# Patient Record
Sex: Male | Born: 1970 | Race: White | Hispanic: No | Marital: Married | Smoking: Never smoker
Health system: Southern US, Community
[De-identification: ages and names within clinical notes are randomized; demographics above are authoritative.]

## PROBLEM LIST (undated history)

## (undated) DIAGNOSIS — J302 Other seasonal allergic rhinitis: Secondary | ICD-10-CM

## (undated) DIAGNOSIS — Z789 Other specified health status: Secondary | ICD-10-CM

## (undated) DIAGNOSIS — T7840XA Allergy, unspecified, initial encounter: Secondary | ICD-10-CM

## (undated) DIAGNOSIS — E785 Hyperlipidemia, unspecified: Secondary | ICD-10-CM

## (undated) HISTORY — PX: NO PAST SURGERIES: SHX2092

## (undated) HISTORY — PX: WISDOM TOOTH EXTRACTION: SHX21

## (undated) HISTORY — DX: Hyperlipidemia, unspecified: E78.5

## (undated) HISTORY — DX: Allergy, unspecified, initial encounter: T78.40XA

## (undated) HISTORY — PX: REFRACTIVE SURGERY: SHX103

---

## 2011-02-01 ENCOUNTER — Emergency Department (INDEPENDENT_AMBULATORY_CARE_PROVIDER_SITE_OTHER): Payer: Managed Care, Other (non HMO)

## 2011-02-01 ENCOUNTER — Emergency Department (HOSPITAL_BASED_OUTPATIENT_CLINIC_OR_DEPARTMENT_OTHER)
Admission: EM | Admit: 2011-02-01 | Discharge: 2011-02-01 | Disposition: A | Discharge status: Home / Self Care | Payer: Managed Care, Other (non HMO) | Attending: Emergency Medicine | Admitting: Emergency Medicine

## 2011-02-01 ENCOUNTER — Other Ambulatory Visit: Payer: Self-pay | Admitting: Orthopedic Surgery

## 2011-02-01 ENCOUNTER — Encounter: Payer: Self-pay | Admitting: *Deleted

## 2011-02-01 DIAGNOSIS — M25539 Pain in unspecified wrist: Secondary | ICD-10-CM

## 2011-02-01 DIAGNOSIS — S62109A Fracture of unspecified carpal bone, unspecified wrist, initial encounter for closed fracture: Secondary | ICD-10-CM

## 2011-02-01 DIAGNOSIS — M24139 Other articular cartilage disorders, unspecified wrist: Secondary | ICD-10-CM

## 2011-02-01 DIAGNOSIS — W19XXXA Unspecified fall, initial encounter: Secondary | ICD-10-CM

## 2011-02-01 DIAGNOSIS — S6390XA Sprain of unspecified part of unspecified wrist and hand, initial encounter: Secondary | ICD-10-CM

## 2011-02-01 DIAGNOSIS — S52123A Displaced fracture of head of unspecified radius, initial encounter for closed fracture: Secondary | ICD-10-CM

## 2011-02-01 DIAGNOSIS — M25529 Pain in unspecified elbow: Secondary | ICD-10-CM | POA: Insufficient documentation

## 2011-02-01 DIAGNOSIS — S62113A Displaced fracture of triquetrum [cuneiform] bone, unspecified wrist, initial encounter for closed fracture: Secondary | ICD-10-CM

## 2011-02-01 DIAGNOSIS — R51 Headache: Secondary | ICD-10-CM | POA: Insufficient documentation

## 2011-02-01 DIAGNOSIS — W11XXXA Fall on and from ladder, initial encounter: Secondary | ICD-10-CM | POA: Insufficient documentation

## 2011-02-01 NOTE — ED Provider Notes (Signed)
History     CSN: [id number]  Arrival date & time 02/01/11  1045   First MD Initiated Contact with Patient 02/01/11 1148      Chief Complaint  Patient presents with  . Fall    (Consider location/radiation/quality/duration/timing/severity/associated sxs/prior treatment) Patient is a 41 y.o. male presenting with fall. The history is provided by the patient. No language interpreter was used.  Fall The accident occurred less than 1 hour ago. The fall occurred from a ladder. He fell from a height of 3 to 5 ft. He landed on a hard floor. The volume of blood lost was minimal. The point of impact was the head, right wrist and right elbow. The pain is present in the right wrist. The pain is at a severity of 7/10. The pain is moderate. He was not ambulatory at the scene. There was no entrapment after the fall. Pertinent negatives include no visual change. The symptoms are aggravated by extension. He has tried nothing for the symptoms.  Pt complains of pain in his head,  Right wrist and elbow.  Pt has a cut to the back of his head.  Pt did not lose conciousness.    History reviewed. No pertinent past medical history.  History reviewed. No pertinent past surgical history.  No family history on file.  History  Substance Use Topics  . Smoking status: Never Smoker   . Smokeless tobacco: Not on file  . Alcohol Use: No      Review of Systems  Musculoskeletal: Positive for joint swelling.  All other systems reviewed and are negative.    Allergies  Review of patient's allergies indicates no known allergies.  Home Medications  No current outpatient prescriptions on file.  BP 122/77  Pulse 78  Temp(Src) 98.2 F (36.8 C) (Oral)  Resp 18  SpO2 100%  Physical Exam  Nursing note and vitals reviewed. Constitutional: He is oriented to person, place, and time. He appears well-developed and well-nourished.  HENT:  Head: Normocephalic and atraumatic.  Right Ear: External ear normal.  Left  Ear: External ear normal.  Nose: Nose normal.  Mouth/Throat: Oropharynx is clear and moist.       3mm superficial laceration occipital scalp  Eyes: Conjunctivae are normal. Pupils are equal, round, and reactive to light.  Neck: Normal range of motion. Neck supple.  Cardiovascular: Normal rate, regular rhythm and normal heart sounds.   Pulmonary/Chest: Effort normal and breath sounds normal.  Abdominal: Soft.  Musculoskeletal: He exhibits edema and tenderness.       Tender right elbow and right wrist  Neurological: He is alert and oriented to person, place, and time. He has normal reflexes.  Skin: Skin is warm.  Psychiatric: He has a normal mood and affect.    ED Course  Procedures (including critical care time)  Labs Reviewed - No data to display Dg Wrist Complete Right  02/01/2011  *RADIOLOGY REPORT*  Clinical Data: Fall.  Wrist injury and pain.  RIGHT WRIST - COMPLETE 3+ VIEW  Comparison: None.  Findings: Dorsal soft tissue swelling is noted.  Small ossific densities are seen along the posterior and the ulnar aspect of the proximal carpal row, consistent with triquetral avulsion fractures.  No other fractures are identified. Alignment remains normal.  IMPRESSION: Small avulsion fractures from the dorsal aspect of the triquetrum.  Original Report Authenticated By: Danae Orleans, M.D.     No diagnosis found.    MDM   Pt has a fracture to radial head and  to wrist.  (Pt spoke to Dr. Merlyn Lot who can see him in office now.)  Pt placed in a posterior splint and discharged to see Dr. York Spaniel [place], [place] 02/01/11 2212

## 2011-02-01 NOTE — ED Notes (Signed)
Fell 3 feet off a step ladder landing onto concrete. Hit the back of his head. Small laceration noted with bleeding controlled. No loc. Denies headache. Pain and swelling to his right wrist. Radial pulse palpated.

## 2011-02-02 NOTE — ED Provider Notes (Signed)
Medical screening examination/treatment/procedure(s) were performed by non-physician practitioner and as supervising physician I was immediately available for consultation/collaboration.  Mikhaila Roh, MD 02/02/11 0654 

## 2011-02-07 ENCOUNTER — Ambulatory Visit
Admission: RE | Admit: 2011-02-07 | Discharge: 2011-02-07 | Disposition: A | Discharge status: Home / Self Care | Payer: Managed Care, Other (non HMO) | Source: Ambulatory Visit | Attending: Orthopedic Surgery | Admitting: Orthopedic Surgery

## 2011-02-07 ENCOUNTER — Other Ambulatory Visit: Payer: Managed Care, Other (non HMO)

## 2011-02-07 DIAGNOSIS — M24139 Other articular cartilage disorders, unspecified wrist: Secondary | ICD-10-CM

## 2011-02-07 DIAGNOSIS — S62113A Displaced fracture of triquetrum [cuneiform] bone, unspecified wrist, initial encounter for closed fracture: Secondary | ICD-10-CM

## 2011-02-07 DIAGNOSIS — S6390XA Sprain of unspecified part of unspecified wrist and hand, initial encounter: Secondary | ICD-10-CM

## 2011-02-07 DIAGNOSIS — S52123A Displaced fracture of head of unspecified radius, initial encounter for closed fracture: Secondary | ICD-10-CM

## 2011-02-07 MED ORDER — IOHEXOL 180 MG/ML  SOLN
2.0000 mL | Freq: Once | INTRAMUSCULAR | Status: AC | PRN
  Administered 2011-02-07: 2 mL via INTRAVENOUS

## 2011-02-08 ENCOUNTER — Ambulatory Visit
Admission: RE | Admit: 2011-02-08 | Discharge: 2011-02-08 | Disposition: A | Discharge status: Home / Self Care | Payer: Managed Care, Other (non HMO) | Source: Ambulatory Visit | Attending: Orthopedic Surgery | Admitting: Orthopedic Surgery

## 2011-02-08 DIAGNOSIS — M24139 Other articular cartilage disorders, unspecified wrist: Secondary | ICD-10-CM

## 2011-02-08 DIAGNOSIS — S62113A Displaced fracture of triquetrum [cuneiform] bone, unspecified wrist, initial encounter for closed fracture: Secondary | ICD-10-CM

## 2011-02-08 DIAGNOSIS — S6390XA Sprain of unspecified part of unspecified wrist and hand, initial encounter: Secondary | ICD-10-CM

## 2011-02-08 DIAGNOSIS — S52123A Displaced fracture of head of unspecified radius, initial encounter for closed fracture: Secondary | ICD-10-CM

## 2011-02-09 ENCOUNTER — Other Ambulatory Visit: Payer: Self-pay | Admitting: Orthopedic Surgery

## 2011-02-13 ENCOUNTER — Encounter (HOSPITAL_BASED_OUTPATIENT_CLINIC_OR_DEPARTMENT_OTHER): Payer: Self-pay | Admitting: *Deleted

## 2011-02-13 NOTE — Progress Notes (Signed)
Fireman-only seasonal allergies No labs needed

## 2011-02-14 ENCOUNTER — Ambulatory Visit (HOSPITAL_BASED_OUTPATIENT_CLINIC_OR_DEPARTMENT_OTHER)
Admission: RE | Admit: 2011-02-14 | Discharge: 2011-02-14 | Disposition: A | Discharge status: Home / Self Care | Payer: Managed Care, Other (non HMO) | Source: Ambulatory Visit | Attending: Orthopedic Surgery | Admitting: Orthopedic Surgery

## 2011-02-14 ENCOUNTER — Encounter (HOSPITAL_BASED_OUTPATIENT_CLINIC_OR_DEPARTMENT_OTHER): Payer: Self-pay | Admitting: Anesthesiology

## 2011-02-14 ENCOUNTER — Encounter (HOSPITAL_BASED_OUTPATIENT_CLINIC_OR_DEPARTMENT_OTHER): Payer: Self-pay | Admitting: Orthopedic Surgery

## 2011-02-14 ENCOUNTER — Encounter (HOSPITAL_BASED_OUTPATIENT_CLINIC_OR_DEPARTMENT_OTHER): Payer: Self-pay | Admitting: *Deleted

## 2011-02-14 ENCOUNTER — Encounter (HOSPITAL_BASED_OUTPATIENT_CLINIC_OR_DEPARTMENT_OTHER)
Admission: RE | Disposition: A | Discharge status: Home / Self Care | Payer: Self-pay | Source: Ambulatory Visit | Attending: Orthopedic Surgery

## 2011-02-14 ENCOUNTER — Ambulatory Visit (HOSPITAL_BASED_OUTPATIENT_CLINIC_OR_DEPARTMENT_OTHER): Payer: Managed Care, Other (non HMO) | Admitting: Anesthesiology

## 2011-02-14 DIAGNOSIS — S52123A Displaced fracture of head of unspecified radius, initial encounter for closed fracture: Secondary | ICD-10-CM | POA: Insufficient documentation

## 2011-02-14 DIAGNOSIS — X58XXXA Exposure to other specified factors, initial encounter: Secondary | ICD-10-CM | POA: Insufficient documentation

## 2011-02-14 HISTORY — DX: Other specified health status: Z78.9

## 2011-02-14 HISTORY — DX: Other seasonal allergic rhinitis: J30.2

## 2011-02-14 HISTORY — PX: ORIF RADIAL FRACTURE: SHX5113

## 2011-02-14 SURGERY — OPEN REDUCTION INTERNAL FIXATION (ORIF) RADIAL FRACTURE
Anesthesia: General | Site: Elbow | Laterality: Right

## 2011-02-14 MED ORDER — ONDANSETRON HCL 4 MG/2ML IJ SOLN
INTRAMUSCULAR | Status: DC | PRN
  Administered 2011-02-14: 4 mg via INTRAVENOUS

## 2011-02-14 MED ORDER — CHLORHEXIDINE GLUCONATE 4 % EX LIQD: 60.0000 mL | Freq: Once | CUTANEOUS | Status: DC

## 2011-02-14 MED ORDER — LIDOCAINE HCL (CARDIAC) 20 MG/ML IV SOLN
INTRAVENOUS | Status: DC | PRN
  Administered 2011-02-14: 40 mg via INTRAVENOUS

## 2011-02-14 MED ORDER — METOCLOPRAMIDE HCL 5 MG/ML IJ SOLN: 10.0000 mg | Freq: Once | INTRAMUSCULAR | Status: DC | PRN

## 2011-02-14 MED ORDER — MIDAZOLAM HCL 2 MG/2ML IJ SOLN
0.5000 mg | INTRAMUSCULAR | Status: DC | PRN
  Administered 2011-02-14: 2 mg via INTRAVENOUS

## 2011-02-14 MED ORDER — CEFAZOLIN SODIUM-DEXTROSE 2-3 GM-% IV SOLR: 2.0000 g | INTRAVENOUS | Status: DC

## 2011-02-14 MED ORDER — ROPIVACAINE HCL 5 MG/ML IJ SOLN
INTRAMUSCULAR | Status: DC | PRN
  Administered 2011-02-14: 30 mL via EPIDURAL

## 2011-02-14 MED ORDER — LIDOCAINE HCL 1 % IJ SOLN
INTRAMUSCULAR | Status: DC | PRN
  Administered 2011-02-14: 2 mL via INTRADERMAL

## 2011-02-14 MED ORDER — FENTANYL CITRATE 0.05 MG/ML IJ SOLN: 25.0000 ug | INTRAMUSCULAR | Status: DC | PRN

## 2011-02-14 MED ORDER — OXYCODONE-ACETAMINOPHEN 5-325 MG PO TABS: 1.0000 | ORAL_TABLET | ORAL | Status: AC | PRN

## 2011-02-14 MED ORDER — LACTATED RINGERS IV SOLN
INTRAVENOUS | Status: DC
  Administered 2011-02-14 (×2): via INTRAVENOUS

## 2011-02-14 MED ORDER — MORPHINE SULFATE 2 MG/ML IJ SOLN: 0.0500 mg/kg | INTRAMUSCULAR | Status: DC | PRN

## 2011-02-14 MED ORDER — CEFAZOLIN SODIUM 1-5 GM-% IV SOLN: 1.0000 g | INTRAVENOUS | Status: DC

## 2011-02-14 MED ORDER — DEXAMETHASONE SODIUM PHOSPHATE 4 MG/ML IJ SOLN
INTRAMUSCULAR | Status: DC | PRN
  Administered 2011-02-14: 10 mg via INTRAVENOUS

## 2011-02-14 MED ORDER — FENTANYL CITRATE 0.05 MG/ML IJ SOLN
50.0000 ug | INTRAMUSCULAR | Status: DC | PRN
  Administered 2011-02-14: 100 ug via INTRAVENOUS

## 2011-02-14 MED ORDER — PROPOFOL 10 MG/ML IV EMUL
INTRAVENOUS | Status: DC | PRN
  Administered 2011-02-14: 250 mg via INTRAVENOUS

## 2011-02-14 SURGICAL SUPPLY — 57 items
BANDAGE GAUZE ELAST BULKY 4 IN (GAUZE/BANDAGES/DRESSINGS) ×2 IMPLANT
BIT DRILL ACUTRAK MICRO 2 (BIT) ×1 IMPLANT
BLADE MINI RND TIP GREEN BEAV (BLADE) IMPLANT
BLADE SURG 15 STRL LF DISP TIS (BLADE) ×1 IMPLANT
BLADE SURG 15 STRL SS (BLADE) ×1
BNDG COHESIVE 3X5 TAN STRL LF (GAUZE/BANDAGES/DRESSINGS) ×2 IMPLANT
BNDG ESMARK 4X9 LF (GAUZE/BANDAGES/DRESSINGS) ×2 IMPLANT
CHLORAPREP W/TINT 26ML (MISCELLANEOUS) ×2 IMPLANT
CLOTH BEACON ORANGE TIMEOUT ST (SAFETY) ×2 IMPLANT
CORDS BIPOLAR (ELECTRODE) ×2 IMPLANT
COVER MAYO STAND STRL (DRAPES) ×2 IMPLANT
COVER TABLE BACK 60X90 (DRAPES) ×2 IMPLANT
CUFF TOURNIQUET SINGLE 18IN (TOURNIQUET CUFF) ×2 IMPLANT
DECANTER SPIKE VIAL GLASS SM (MISCELLANEOUS) IMPLANT
DRAPE EXTREMITY T 121X128X90 (DRAPE) ×2 IMPLANT
DRAPE OEC MINIVIEW 54X84 (DRAPES) ×2 IMPLANT
DRAPE SURG 17X23 STRL (DRAPES) ×2 IMPLANT
DRILL ACUTRAK MICRO 2 (BIT) ×2
DRSG KUZMA FLUFF (GAUZE/BANDAGES/DRESSINGS) IMPLANT
ELECT REM PT RETURN 9FT ADLT (ELECTROSURGICAL)
ELECTRODE REM PT RTRN 9FT ADLT (ELECTROSURGICAL) IMPLANT
GAUZE XEROFORM 1X8 LF (GAUZE/BANDAGES/DRESSINGS) ×2 IMPLANT
GLOVE BIO SURGEON STRL SZ 6.5 (GLOVE) ×2 IMPLANT
GLOVE BIO SURGEON STRL SZ8.5 (GLOVE) ×2 IMPLANT
GLOVE BIOGEL PI IND STRL 8.5 (GLOVE) ×1 IMPLANT
GLOVE BIOGEL PI INDICATOR 8.5 (GLOVE) ×1
GLOVE SURG ORTHO 8.0 STRL STRW (GLOVE) ×2 IMPLANT
GOWN BRE IMP PREV XXLGXLNG (GOWN DISPOSABLE) ×2 IMPLANT
GOWN PREVENTION PLUS XLARGE (GOWN DISPOSABLE) ×4 IMPLANT
GUIDEWIRE ORTHO MINI ACTK .045 (WIRE) ×4 IMPLANT
KWIRE 4.0 X .045IN (WIRE) IMPLANT
NS IRRIG 1000ML POUR BTL (IV SOLUTION) ×2 IMPLANT
PACK BASIN DAY SURGERY FS (CUSTOM PROCEDURE TRAY) ×2 IMPLANT
PAD CAST 3X4 CTTN HI CHSV (CAST SUPPLIES) ×1 IMPLANT
PADDING CAST ABS 4INX4YD NS (CAST SUPPLIES) ×1
PADDING CAST ABS COTTON 4X4 ST (CAST SUPPLIES) ×1 IMPLANT
PADDING CAST COTTON 3X4 STRL (CAST SUPPLIES) ×1
PENCIL BUTTON HOLSTER BLD 10FT (ELECTRODE) IMPLANT
SCREW ACUTRAK 2 MINI 22MM (Screw) ×4 IMPLANT
SLEEVE SCD COMPRESS KNEE MED (MISCELLANEOUS) ×2 IMPLANT
SPLINT PLASTER CAST XFAST 3X15 (CAST SUPPLIES) ×15 IMPLANT
SPLINT PLASTER XTRA FASTSET 3X (CAST SUPPLIES) ×15
SPONGE GAUZE 4X4 12PLY (GAUZE/BANDAGES/DRESSINGS) ×2 IMPLANT
STOCKINETTE 4X48 STRL (DRAPES) ×2 IMPLANT
SUT VIC AB 0 CT1 27 (SUTURE) ×1
SUT VIC AB 0 CT1 27XBRD ANBCTR (SUTURE) ×1 IMPLANT
SUT VIC AB 2-0 SH 27 (SUTURE)
SUT VIC AB 2-0 SH 27XBRD (SUTURE) IMPLANT
SUT VIC AB 3-0 FS2 27 (SUTURE) IMPLANT
SUT VICRYL 4-0 PS2 18IN ABS (SUTURE) ×2 IMPLANT
SUT VICRYL RAPID 5 0 P 3 (SUTURE) IMPLANT
SUT VICRYL RAPIDE 4/0 PS 2 (SUTURE) ×2 IMPLANT
SYR BULB 3OZ (MISCELLANEOUS) ×2 IMPLANT
SYR CONTROL 10ML LL (SYRINGE) IMPLANT
TOWEL OR 17X24 6PK STRL BLUE (TOWEL DISPOSABLE) ×2 IMPLANT
UNDERPAD 30X30 INCONTINENT (UNDERPADS AND DIAPERS) ×2 IMPLANT
WATER STERILE IRR 1000ML POUR (IV SOLUTION) IMPLANT

## 2011-02-14 NOTE — Op Note (Signed)
Dictated number: [id number]

## 2011-02-14 NOTE — H&P (Signed)
Gabriel Snyder is a 41 year-old right-hand dominant male who is referred by his wife.  He suffered a fall today on his right dominant arm.  He was seen at [facility] where x-rays reveal a fracture of his radial head and a fracture of his triquetrum.  He complains of wrist, elbow pain. He was placed in a splint.  He complains of an intermittent, moderate aching type pain with a feeling of tingling. Activity makes this worse.  He has no prior history of injury.  He states he fell three to four week from the stepladder and there is no family history of diabetes, thyroid problems, arthritis or gout.  ALLERGIES:   None.  MEDICATIONS:    Allergy injections.  SURGICAL HISTORY:    LASIK surgery  FAMILY MEDICAL HISTORY:   Negative.  SOCIAL HISTORY:    He does not smoke or drink.  He is married. He is a IT sales professional.   Gabriel Snyder is an 41 y.o. male.     Chief Complaint: Fracture radial head rt HPI: see above  Past Medical History  Diagnosis Date  . Seasonal allergies   . No pertinent past medical history     Past Surgical History  Procedure Date  . Wisdom tooth extraction   . Refractive surgery   . No past surgeries     History reviewed. No pertinent family history. Social History:  reports that he has never smoked. He does not have any smokeless tobacco history on file. He reports that he drinks alcohol. He reports that he does not use illicit drugs.  Allergies: No Known Allergies  Medications Prior to Admission  Medication Dose Route Frequency Provider Last Rate Last Dose  . ceFAZolin (ANCEF) IVPB 2 g/50 mL premix  2 g Intravenous 60 min Pre-Op Nicki Reaper, MD      . chlorhexidine (HIBICLENS) 4 % liquid 4 application  60 mL Topical Once       . fentaNYL (SUBLIMAZE) injection 50-100 mcg  50-100 mcg Intravenous PRN Constance Goltz, MD   100 mcg at 02/14/11 0854  . lactated ringers infusion   Intravenous Continuous Constance Goltz, MD 10 mL/hr at 02/14/11  0848    . midazolam (VERSED) injection 0.5-2 mg  0.5-2 mg Intravenous PRN Constance Goltz, MD   2 mg at 02/14/11 0854  . DISCONTD: ceFAZolin (ANCEF) IVPB 1 g/50 mL premix  1 g Intravenous 60 min Pre-Op        Medications Prior to Admission  Medication Sig Dispense Refill  . cetirizine (ZYRTEC) 10 MG tablet Take 10 mg by mouth as needed.      Marland Kitchen HYDROcodone-acetaminophen (NORCO) 5-325 MG per tablet Take 1 tablet by mouth every 6 (six) hours as needed.      Marland Kitchen ibuprofen (ADVIL,MOTRIN) 200 MG tablet Take 200 mg by mouth every 6 (six) hours as needed.        Results for orders placed during the hospital encounter of 02/14/11 (from the past 48 hour(s))  POCT HEMOGLOBIN-HEMACUE     Status: Normal   Collection Time   02/14/11  8:51 AM      Component Value Range Comment   Hemoglobin 15.6  13.0 - 17.0 (g/dL)     No results found.   Pertinent items are noted in HPI.  Blood pressure 127/79, pulse 78, temperature 98.1 F (36.7 C), temperature source Oral, resp. rate 18, height 5\' 11"  (1.803 m), weight 83.915 kg (185 lb), SpO2 96.00%.  General appearance: alert, cooperative and appears stated age Head: Normocephalic, without obvious abnormality Neck: no adenopathy Resp: clear to auscultation bilaterally Cardio: regular rate and rhythm, S1, S2 normal, no murmur, click, rub or gallop GI: soft, non-tender; bowel sounds normal; no masses,  no organomegaly Extremities: extremities normal, atraumatic, no cyanosis or edema Pulses: 2+ and symmetric Skin: Skin color, texture, turgor normal. No rashes or lesions Neurologic: Grossly normal Incision/Wound: na  Assessment/Plan We will plan on ORIF, possible radial head replacement. We will treat the distal radius conservatively with a splint cast. He is advised of potential for infection, injury to arteries, nerves, tendons, incomplete relief of symptoms and dystrophy, possibility of nonunion, dislocation, loss of fixation of the radial head,  arthritic changes, stiffness to his elbow, prosthesis failure. He would like to proceed.   Willodene Stallings R 02/14/2011, 10:38 AM

## 2011-02-14 NOTE — Transfer of Care (Signed)
Immediate Anesthesia Transfer of Care Note  Patient: Gabriel Snyder  Procedure(s) Performed:  OPEN REDUCTION INTERNAL FIXATION (ORIF) RADIAL FRACTURE - right radial head, possible radial head replacement  Patient Location: PACU  Anesthesia Type: GA combined with regional for post-op pain  Level of Consciousness: sedated  Airway & Oxygen Therapy: Patient Spontanous Breathing and Patient connected to face mask oxygen  Post-op Assessment: Report given to PACU RN and Post -op Vital signs reviewed and stable  Post vital signs: Reviewed and stable Filed Vitals:   02/14/11 0910  BP:   Pulse: 78  Temp:   Resp: 18    Complications: No apparent anesthesia complications

## 2011-02-14 NOTE — Anesthesia Postprocedure Evaluation (Signed)
Anesthesia Post Note  Patient: Gabriel Snyder  Procedure(s) Performed:  OPEN REDUCTION INTERNAL FIXATION (ORIF) RADIAL FRACTURE - right radial head, possible radial head replacement  Anesthesia type: General  Patient location: PACU  Post pain: Pain level controlled  Post assessment: Patient's Cardiovascular Status Stable  Last Vitals:  Filed Vitals:   02/14/11 1245  BP:   Pulse: 71  Temp:   Resp: 12    Post vital signs: Reviewed and stable  Level of consciousness: alert  Complications: No apparent anesthesia complications

## 2011-02-14 NOTE — Brief Op Note (Signed)
02/14/2011  12:00 PM  PATIENT:  Quitman Livings  41 y.o. male  PRE-OPERATIVE DIAGNOSIS:  fracture right radial head  POST-OPERATIVE DIAGNOSIS:  fracture right radial head  PROCEDURE:  Procedure(s): OPEN REDUCTION INTERNAL FIXATION (ORIF) RADIAL FRACTURE  SURGEON:  Surgeon(s): Nicki Reaper, MD Marlowe Shores, MD  PHYSICIAN ASSISTANT:   ASSISTANTS: Alyssa Grove, MD   ANESTHESIA:   regional and general  EBL:  Total I/O In: 1400 [I.V.:1400] Out: -   BLOOD ADMINISTERED:none  DRAINS: none   LOCAL MEDICATIONS USED:  NONE  SPECIMEN:  No Specimen  DISPOSITION OF SPECIMEN:  N/A  COUNTS:  YES  TOURNIQUET:   Total Tourniquet Time Documented: Upper Arm (Left) - 46 minutes  DICTATION: .Other Dictation: Dictation Number [id number]  PLAN OF CARE: Discharge to home after PACU  PATIENT DISPOSITION:  PACU - hemodynamically stable.

## 2011-02-14 NOTE — Anesthesia Procedure Notes (Addendum)
Anesthesia Regional Block:  Supraclavicular block  Pre-Anesthetic Checklist: ,, timeout performed, Correct Patient, Correct Site, Correct Laterality, Correct Procedure, Correct Position, site marked, Risks and benefits discussed,  Surgical consent,  Pre-op evaluation,  At surgeon's request and post-op pain management  Laterality: Right  Prep: chloraprep       Needles:   Needle Type: Other   (Arrow Echogenic)   Needle Length: 9cm  Needle Gauge: 21    Additional Needles:  Procedures: ultrasound guided Supraclavicular block Narrative:  Start time: 02/14/2011 8:53 AM End time: 02/14/2011 9:04 AM Injection made incrementally with aspirations every 5 mL.  Performed by: Personally  Anesthesiologist: C Frederick  Additional Notes: Ultrasound guidance used to: id relevant anatomy, confirm needle position, local anesthetic spread, avoidance of vascular puncture. Picture saved. No complications. Block performed personally by Janetta Hora. Gelene Mink, MD    Supraclavicular block Procedure Name: LMA Insertion Date/Time: 02/14/2011 10:54 AM Performed by: Signa Kell Pre-anesthesia Checklist: Patient identified, Emergency Drugs available, Suction available and Patient being monitored Patient Re-evaluated:Patient Re-evaluated prior to inductionOxygen Delivery Method: Circle System Utilized Preoxygenation: Pre-oxygenation with 100% oxygen Intubation Type: IV induction Ventilation: Mask ventilation without difficulty LMA: LMA inserted LMA Size: 5.0 Number of attempts: 1 Airway Equipment and Method: bite block Placement Confirmation: positive ETCO2 Tube secured with: Tape Dental Injury: Teeth and Oropharynx as per pre-operative assessment

## 2011-02-14 NOTE — Progress Notes (Signed)
Assisted Dr. Frederick with right, ultrasound guided, supraclavicular block. Side rails up, monitors on throughout procedure. See vital signs in flow sheet. Tolerated Procedure well. 

## 2011-02-14 NOTE — Anesthesia Preprocedure Evaluation (Signed)
Anesthesia Evaluation  Patient identified by MRN, date of birth, ID band Patient awake    Reviewed: Allergy & Precautions, H&P , NPO status , Patient's Chart, lab work & pertinent test results, reviewed documented beta blocker date and time   Airway Mallampati: II TM Distance: >3 FB Neck ROM: full    Dental   Pulmonary neg pulmonary ROS,          Cardiovascular neg cardio ROS     Neuro/Psych Negative Neurological ROS  Negative Psych ROS   GI/Hepatic negative GI ROS, Neg liver ROS,   Endo/Other  Negative Endocrine ROS  Renal/GU negative Renal ROS  Genitourinary negative   Musculoskeletal   Abdominal   Peds  Hematology negative hematology ROS (+)   Anesthesia Other Findings See surgeon's H&P   Reproductive/Obstetrics negative OB ROS                           Anesthesia Physical Anesthesia Plan  ASA: I  Anesthesia Plan: General   Post-op Pain Management: MAC Combined w/ Regional for Post-op pain   Induction: Intravenous  Airway Management Planned: LMA  Additional Equipment:   Intra-op Plan:   Post-operative Plan: Extubation in OR  Informed Consent: I have reviewed the patients History and Physical, chart, labs and discussed the procedure including the risks, benefits and alternatives for the proposed anesthesia with the patient or authorized representative who has indicated his/her understanding and acceptance.     Plan Discussed with: CRNA and Surgeon  Anesthesia Plan Comments:         Anesthesia Quick Evaluation  

## 2011-02-15 ENCOUNTER — Encounter (HOSPITAL_BASED_OUTPATIENT_CLINIC_OR_DEPARTMENT_OTHER): Payer: Self-pay | Admitting: Orthopedic Surgery

## 2011-02-15 NOTE — Op Note (Signed)
NAMEOVERTON, BOGGUS NO.:  [id number]  MEDICAL RECORD NO.:  [id number]  LOCATION:                                 FACILITY:  PHYSICIAN:  Cindee Salt, M.D.       DATE OF BIRTH:  [birth year]  DATE OF PROCEDURE:  02/14/2011 DATE OF DISCHARGE:                              OPERATIVE REPORT   PREOPERATIVE DIAGNOSIS:  Fracture of radial head, right elbow.  POSTOPERATIVE DIAGNOSIS:  Fracture of radial head, right elbow.  OPERATION:  Open reduction and internal fixation, radial head fracture, right elbow.  SURGEON:  Cindee Salt, MD  ASSISTANT:  Artist Pais. Mina Marble, MD  ANESTHESIA:  Axillary general.  ANESTHESIOLOGIST:  Janetta Hora. 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  He has elected to undergo open reduction and internal fixation, possible replacement of the radial head fracture with displacement.  The distal radius fracture is nondisplaced.  He is aware of risks and complications including infection, recurrence, injury to arteries, nerves, tendons, incomplete relief of symptoms, dystrophy, stiffness, loss of mobility to the elbow, the possibility of further procedures being necessary especially with radial head replacement.  In the preoperative area, the patient is seen.  The extremity marked by both the patient and surgeon. Antibiotic given.  PROCEDURE:  The patient was brought to the operating room where a general anesthesia was carried out without difficulty after a supraclavicular block was carried out.  He was prepped using ChloraPrep, supine position with the right arm free.  A 3-minute dry time was allowed.  Time-out taken, confirming the patient and procedure.  The limb was exsanguinated with an Esmarch bandage.  Tourniquet placed high on the arm was inflated to 250 mmHg.  The fovea of the distal ulna was marked using image intensification with the  possibility of a radial head replacement.  A longitudinal incision was made obliquely over the lateral epicondyle of the right elbow and carried down through subcutaneous tissue.  Bleeders were electrocauterized.  The dissection was carried down to the anconeus and extensor interval.  This was deepened down to the joint.  The radial head and neck were identified. The fracture was immediately apparent.  However, this was intact on the most radial side.  The ulnar side was sheared.  With pronation and supination, was not able to be visualized across, appeared to be one large fragment.  This was then elevated with the periosteal elevator.  X- rays confirmed the elevation.  This was maintained in position and 2 guide pins for Acutrak mini screws were placed.  X-rays again confirmed reduction of the fracture in full pronation supination and oblique. This allowed the pins to be measured.  These measured approximately 24 mm.  The 22 screws were then selected.  The cortex broached on the radial side and 2 Acutrak screws 22 mm in length were each inserted. Full pronation, supination was allowed following implementation of the screws.  X-rays in AP, lateral in full pronation supination revealed the fracture reduced in both AP and lateral directions with it well compressed.  The wound was  copiously irrigated with saline.  The interval was then closed along with the joint with figure-of-eight 0 Vicryl sutures, the subcutaneous tissue closed with interrupted 2-0 Vicryl, and the skin with a subcuticular 4-0 Vicryl Rapide suture.  A sterile compressive dressing and long-arm splint was applied.  X-rays of the wrist revealed no displacement of the distal radius fracture.  On deflation of the tourniquet, all fingers immediately pinked.  He was taken to the recovery room for observation in satisfactory condition.          ______________________________ Cindee Salt, M.D.     GK/MEDQ  D:  02/14/2011   T:  02/15/2011  Job:  [id number]

## 2011-10-15 ENCOUNTER — Other Ambulatory Visit: Payer: Self-pay | Admitting: Occupational Medicine

## 2011-10-15 ENCOUNTER — Ambulatory Visit (HOSPITAL_BASED_OUTPATIENT_CLINIC_OR_DEPARTMENT_OTHER)
Admission: RE | Admit: 2011-10-15 | Discharge: 2011-10-15 | Disposition: A | Discharge status: Home / Self Care | Payer: Self-pay | Source: Ambulatory Visit | Attending: Occupational Medicine | Admitting: Occupational Medicine

## 2011-10-15 DIAGNOSIS — Z0000 Encounter for general adult medical examination without abnormal findings: Secondary | ICD-10-CM | POA: Insufficient documentation

## 2013-01-25 IMAGING — CR DG CHEST 1V
1 series · 1 of 1 positions shown · non-contrast
Comparison: No priors.

CLINICAL DATA: Physical examination.

CHEST - 1 VIEW

[w chest pa]
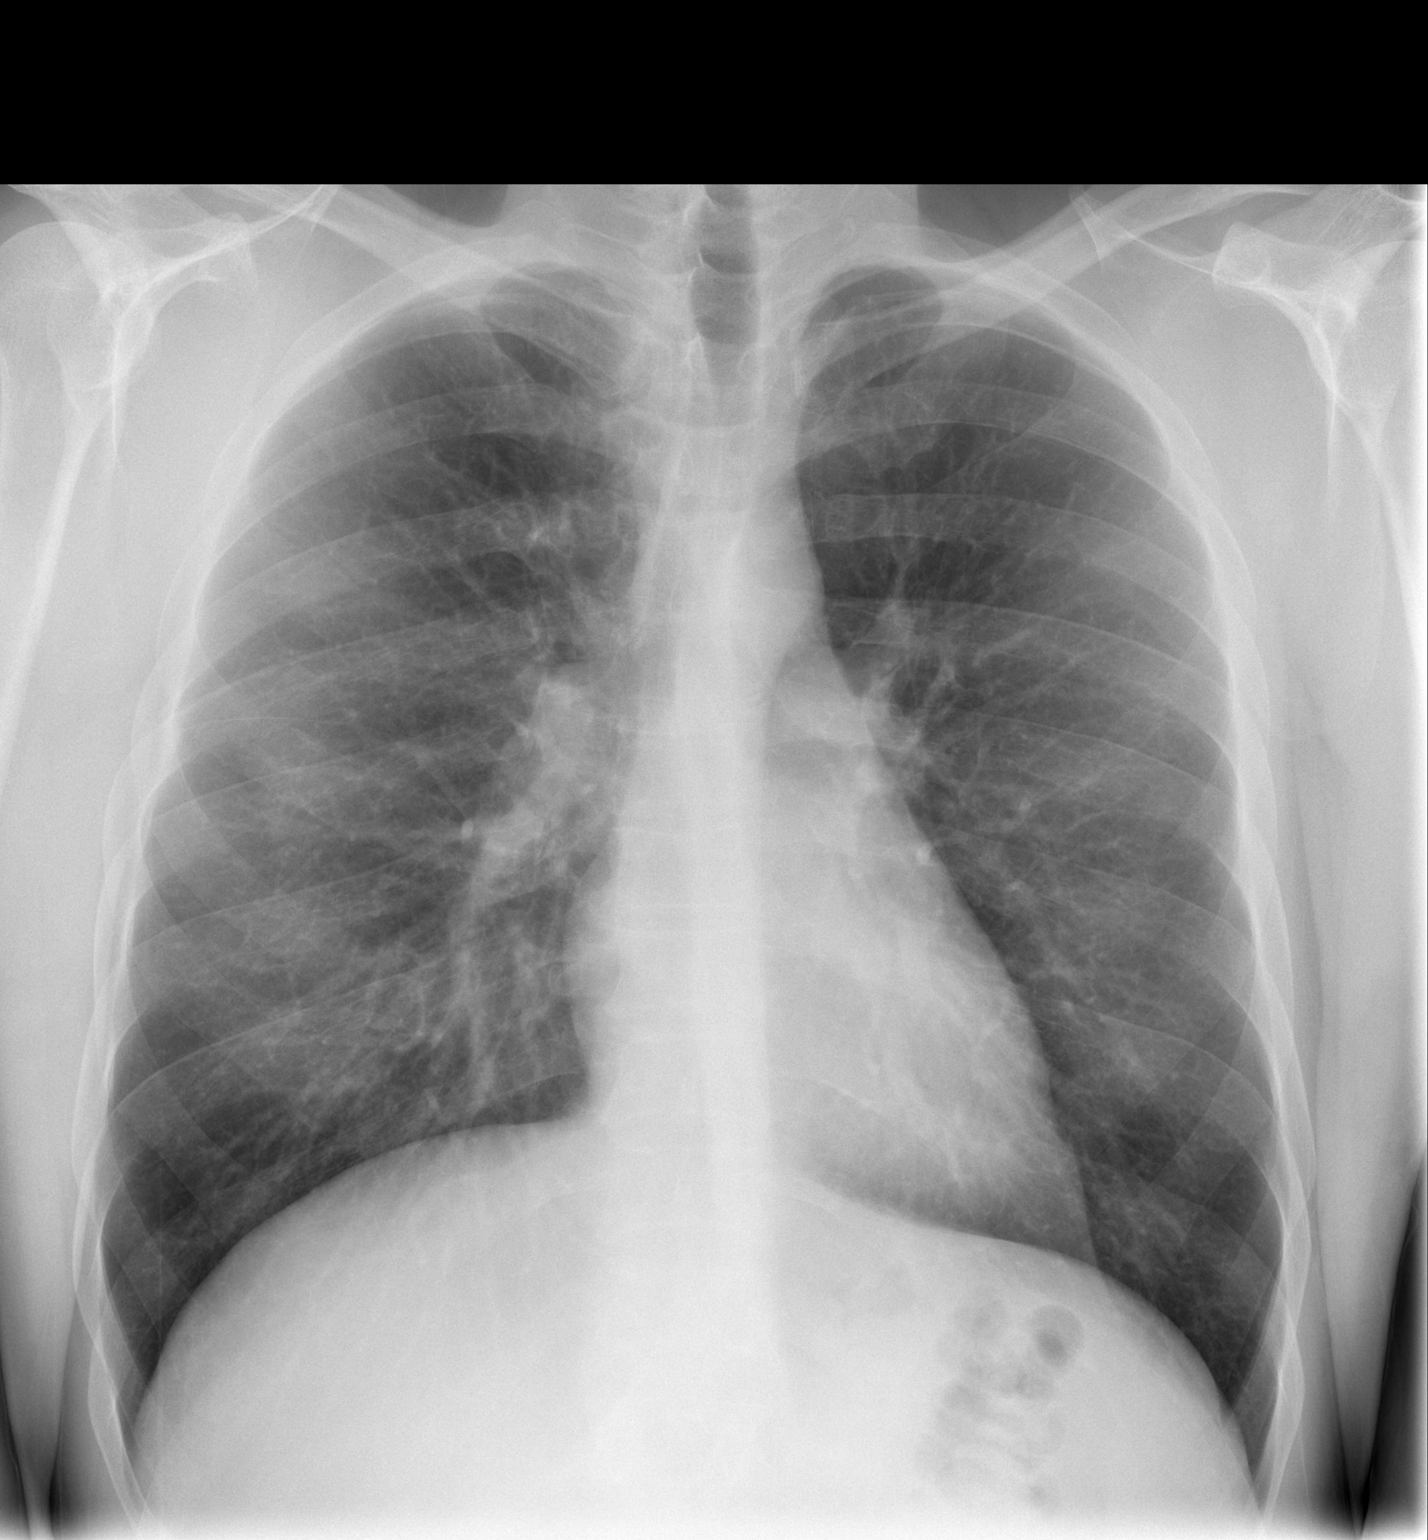

[1 of 1 positions shown; findings below may reference images not displayed]

FINDINGS: Lung volumes are normal.  No consolidative airspace
disease.  No pleural effusions.  No pneumothorax.  No pulmonary
nodule or mass noted.  Pulmonary vasculature and the
cardiomediastinal silhouette are within normal limits.
IMPRESSION: 1. No radiographic evidence of acute cardiopulmonary disease.

## 2015-10-20 DIAGNOSIS — J302 Other seasonal allergic rhinitis: Secondary | ICD-10-CM | POA: Insufficient documentation

## 2015-10-20 DIAGNOSIS — K219 Gastro-esophageal reflux disease without esophagitis: Secondary | ICD-10-CM | POA: Insufficient documentation

## 2019-10-20 ENCOUNTER — Ambulatory Visit: Payer: Managed Care, Other (non HMO) | Admitting: Family Medicine

## 2019-11-06 ENCOUNTER — Ambulatory Visit: Payer: Managed Care, Other (non HMO) | Admitting: Family Medicine

## 2020-02-17 ENCOUNTER — Ambulatory Visit: Payer: Managed Care, Other (non HMO) | Admitting: Family Medicine

## 2020-03-28 ENCOUNTER — Ambulatory Visit: Payer: Managed Care, Other (non HMO) | Admitting: Family Medicine

## 2020-03-31 ENCOUNTER — Ambulatory Visit: Payer: Managed Care, Other (non HMO) | Admitting: Medical-Surgical

## 2020-04-01 ENCOUNTER — Ambulatory Visit: Payer: Managed Care, Other (non HMO) | Admitting: Medical-Surgical

## 2020-04-01 ENCOUNTER — Other Ambulatory Visit: Payer: Self-pay

## 2020-04-01 ENCOUNTER — Encounter: Payer: Self-pay | Admitting: Medical-Surgical

## 2020-04-01 VITALS — BP 118/76 | HR 70 | Temp 98.3°F | Ht 71.0 in | Wt 189.4 lb

## 2020-04-01 DIAGNOSIS — Z1329 Encounter for screening for other suspected endocrine disorder: Secondary | ICD-10-CM

## 2020-04-01 DIAGNOSIS — Z1159 Encounter for screening for other viral diseases: Secondary | ICD-10-CM

## 2020-04-01 DIAGNOSIS — Z0000 Encounter for general adult medical examination without abnormal findings: Secondary | ICD-10-CM | POA: Diagnosis not present

## 2020-04-01 DIAGNOSIS — Z1211 Encounter for screening for malignant neoplasm of colon: Secondary | ICD-10-CM

## 2020-04-01 DIAGNOSIS — Z114 Encounter for screening for human immunodeficiency virus [HIV]: Secondary | ICD-10-CM | POA: Diagnosis not present

## 2020-04-01 DIAGNOSIS — Z7689 Persons encountering health services in other specified circumstances: Secondary | ICD-10-CM | POA: Diagnosis not present

## 2020-04-01 LAB — CBC WITH DIFFERENTIAL/PLATELET
Lymphs Abs: 1580 cells/uL (ref 850–3900)
Neutro Abs: 4313 cells/uL (ref 1500–7800)

## 2020-04-01 NOTE — Progress Notes (Signed)
New Patient Office Visit  Subjective:  Patient ID: Gabriel Snyder, male    DOB: [birth year]  Age: 50 y.o. MRN: [id number]  CC:  Chief Complaint  Patient presents with   Establish Care   Annual Exam    HPI Gabriel Snyder presents to establish care.   Dentist: every 6 months, no concerns Eye exam: at least 2 years, just readers glasses, had Lasik surgery 1997 Exercise: regular activity  Diet: no special diets, eats all food groups Colon cancer screening:  Prostate cancer screening: done 07/2019, normal COVID vaccines: Done  No concerns today.    Past Medical History:  Diagnosis Date   No pertinent past medical history    No pertinent past medical history    Seasonal allergies     Past Surgical History:  Procedure Laterality Date   ORIF RADIAL FRACTURE  02/14/2011   Procedure: OPEN REDUCTION INTERNAL FIXATION (ORIF) RADIAL FRACTURE;  Surgeon: Wynonia Sours, MD;  Location: [facility];  Service: Orthopedics;  Laterality: Right;  right radial head, possible radial head replacement   REFRACTIVE SURGERY     WISDOM TOOTH EXTRACTION      Family History  Problem Relation Age of Onset   Hypertension Other    Stroke Other     Social History   Socioeconomic History   Marital status: Married    Spouse name: Not on file   Number of children: Not on file   Years of education: Not on file   Highest education level: Not on file  Occupational History   Not on file  Tobacco Use   Smoking status: Never Smoker   Smokeless tobacco: Never Used  Substance and Sexual Activity   Alcohol use: Not Currently   Drug use: Never   Sexual activity: Yes    Partners: Female  Other Topics Concern   Not on file  Social History Narrative   Not on file   Social Determinants of Health   Financial Resource Strain: Not on file  Food Insecurity: Not on file  Transportation Needs: Not on file  Physical Activity: Not on file  Stress: Not on file  Social  Connections: Not on file  Intimate Partner Violence: Not on file    ROS Review of Systems  Constitutional: Negative for chills, fatigue, fever and unexpected weight change.  HENT: Negative for congestion, rhinorrhea, sinus pressure and sore throat.   Eyes: Negative for visual disturbance.  Respiratory: Negative for cough, chest tightness, shortness of breath and wheezing.   Cardiovascular: Negative for chest pain, palpitations and leg swelling.  Gastrointestinal: Negative for abdominal pain, blood in stool, constipation, diarrhea, nausea and vomiting.  Endocrine: Negative for cold intolerance, heat intolerance, polydipsia, polyphagia and polyuria.  Genitourinary: Negative for dysuria, frequency, hematuria and urgency.  Musculoskeletal: Positive for arthralgias (bilateral knees).  Skin: Negative for rash.  Allergic/Immunologic: Negative for environmental allergies and food allergies.  Neurological: Negative for dizziness, light-headedness and headaches.  Psychiatric/Behavioral: Negative for dysphoric mood, self-injury, sleep disturbance and suicidal ideas. The patient is not nervous/anxious.     Objective:   Today's Vitals: BP 118/76    Pulse 70    Temp 98.3 F (36.8 C)    Ht 5\' 11"  (1.803 m)    Wt 189 lb 6.4 oz (85.9 kg)    SpO2 98%    BMI 26.42 kg/m   Physical Exam Constitutional:      General: He is not in acute distress.    Appearance: Normal appearance. He  is not ill-appearing.  HENT:     Head: Normocephalic and atraumatic.     Right Ear: Tympanic membrane normal.     Left Ear: Tympanic membrane normal.     Nose: Nose normal.     Mouth/Throat:     Mouth: Mucous membranes are moist.     Pharynx: No oropharyngeal exudate or posterior oropharyngeal erythema.  Eyes:     Extraocular Movements: Extraocular movements intact.     Conjunctiva/sclera: Conjunctivae normal.     Pupils: Pupils are equal, round, and reactive to light.  Neck:     Thyroid: No thyromegaly.      Vascular: No carotid bruit or JVD.     Trachea: Trachea normal.  Cardiovascular:     Rate and Rhythm: Normal rate and regular rhythm.     Pulses: Normal pulses.     Heart sounds: Normal heart sounds. No murmur heard. No friction rub. No gallop.   Pulmonary:     Effort: Pulmonary effort is normal. No respiratory distress.     Breath sounds: Normal breath sounds. No wheezing.  Abdominal:     General: Bowel sounds are normal. There is no distension.     Palpations: Abdomen is soft.     Tenderness: There is no abdominal tenderness. There is no guarding.  Musculoskeletal:        General: Normal range of motion.     Cervical back: Normal range of motion and neck supple.  Skin:    General: Skin is warm and dry.  Neurological:     Mental Status: He is alert and oriented to person, place, and time.     Cranial Nerves: No cranial nerve deficit.  Psychiatric:        Mood and Affect: Mood normal.        Behavior: Behavior normal.        Thought Content: Thought content normal.        Judgment: Judgment normal.     Assessment & Plan:   1. Encounter to establish care Reviewed available information and discussed care concerns with patient. 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   2. Annual physical exam Checking labs as below.  - CBC with Differential/Platelet - COMPLETE METABOLIC PANEL WITH GFR - Lipid panel  3. Need for hepatitis C screening test/HIV screening Discussed screening recommendations. Patient agreeable so adding to blood work today. - Hepatitis C antibody  4. Colon cancer screening Referring to GI for colonoscopy. - Ambulatory referral to Gastroenterology  5. Screening for endocrine disorder Checking TSH. - TSH  Outpatient Encounter Medications as of 04/01/2020  Medication Sig   [DISCONTINUED] cetirizine (ZYRTEC) 10 MG tablet Take 10 mg by mouth as needed.   [DISCONTINUED] HYDROcodone-acetaminophen (NORCO)  5-325 MG per tablet Take 1 tablet by mouth every 6 (six) hours as needed.   [DISCONTINUED] ibuprofen (ADVIL,MOTRIN) 200 MG tablet Take 200 mg by mouth every 6 (six) hours as needed.   No facility-administered encounter medications on file as of 04/01/2020.   Follow-up: Return in about 1 year (around 04/01/2021) for annual physical exam or sooner if needed.   Clearnce Sorrel, DNP, APRN, FNP-BC [facility]

## 2020-04-01 NOTE — Patient Instructions (Signed)

## 2020-04-02 LAB — COMPLETE METABOLIC PANEL WITH GFR
AST: 24 U/L (ref 10–35)
BUN: 16 mg/dL (ref 7–25)
GFR, Est African American: 108 mL/min/{1.73_m2} (ref >=60)
GFR, Est Non African American: 93 mL/min/{1.73_m2} (ref >=60)

## 2020-04-02 LAB — TSH: TSH: 1.61 mIU/L (ref 0.40–4.50)

## 2020-04-02 LAB — LIPID PANEL: Cholesterol: 257 mg/dL — ABNORMAL HIGH (ref <200)

## 2020-04-04 LAB — LIPID PANEL
HDL: 56 mg/dL (ref >=40)
LDL Cholesterol (Calc): 175 mg/dL (calc) — ABNORMAL HIGH
Non-HDL Cholesterol (Calc): 201 mg/dL (calc) — ABNORMAL HIGH (ref <130)
Total CHOL/HDL Ratio: 4.6 (calc) (ref <5.0)
Triglycerides: 128 mg/dL (ref <150)

## 2020-04-04 LAB — CBC WITH DIFFERENTIAL/PLATELET
Absolute Monocytes: 566 cells/uL (ref 200–950)
Basophils Absolute: 41 cells/uL (ref 0–200)
Basophils Relative: 0.6 %
Eosinophils Absolute: 400 cells/uL (ref 15–500)
Eosinophils Relative: 5.8 %
HCT: 48.9 % (ref 38.5–50.0)
Hemoglobin: 16.7 g/dL (ref 13.2–17.1)
MCH: 31.5 pg (ref 27.0–33.0)
MCHC: 34.2 g/dL (ref 32.0–36.0)
MCV: 92.1 fL (ref 80.0–100.0)
MPV: 10.7 fL (ref 7.5–12.5)
Monocytes Relative: 8.2 %
Neutrophils Relative %: 62.5 %
Platelets: 253 10*3/uL (ref 140–400)
RBC: 5.31 10*6/uL (ref 4.20–5.80)
RDW: 11.8 % (ref 11.0–15.0)
Total Lymphocyte: 22.9 %
WBC: 6.9 10*3/uL (ref 3.8–10.8)

## 2020-04-04 LAB — HEPATITIS C ANTIBODY
Hepatitis C Ab: NONREACTIVE
SIGNAL TO CUT-OFF: 0.05 (ref <1.00)

## 2020-04-04 LAB — COMPLETE METABOLIC PANEL WITH GFR
AG Ratio: 1.7 (calc) (ref 1.0–2.5)
ALT: 27 U/L (ref 9–46)
Albumin: 4.7 g/dL (ref 3.6–5.1)
Alkaline phosphatase (APISO): 89 U/L (ref 35–144)
CO2: 27 mmol/L (ref 20–32)
Calcium: 10 mg/dL (ref 8.6–10.3)
Chloride: 103 mmol/L (ref 98–110)
Creat: 0.95 mg/dL (ref 0.70–1.33)
Globulin: 2.7 g/dL (calc) (ref 1.9–3.7)
Glucose, Bld: 81 mg/dL (ref 65–99)
Potassium: 4.5 mmol/L (ref 3.5–5.3)
Sodium: 141 mmol/L (ref 135–146)
Total Bilirubin: 0.5 mg/dL (ref 0.2–1.2)
Total Protein: 7.4 g/dL (ref 6.1–8.1)

## 2020-04-04 LAB — HIV ANTIBODY (ROUTINE TESTING W REFLEX): HIV 1&2 Ab, 4th Generation: NONREACTIVE

## 2020-04-06 ENCOUNTER — Encounter: Payer: Self-pay | Admitting: Gastroenterology

## 2020-04-19 ENCOUNTER — Telehealth: Payer: Self-pay | Admitting: Gastroenterology

## 2020-04-19 NOTE — Telephone Encounter (Signed)
Spoke with pt, he does not wish to reschedule his appts, will keep appts as scheduled

## 2020-05-06 ENCOUNTER — Ambulatory Visit: Payer: Managed Care, Other (non HMO) | Admitting: Family Medicine

## 2020-05-19 ENCOUNTER — Ambulatory Visit (AMBULATORY_SURGERY_CENTER): Payer: Self-pay | Admitting: *Deleted

## 2020-05-19 ENCOUNTER — Encounter (INDEPENDENT_AMBULATORY_CARE_PROVIDER_SITE_OTHER): Payer: Self-pay

## 2020-05-19 ENCOUNTER — Other Ambulatory Visit: Payer: Self-pay

## 2020-05-19 VITALS — Ht 71.0 in | Wt 190.6 lb

## 2020-05-19 DIAGNOSIS — Z1211 Encounter for screening for malignant neoplasm of colon: Secondary | ICD-10-CM

## 2020-05-19 MED ORDER — SUPREP BOWEL PREP KIT 17.5-3.13-1.6 GM/177ML PO SOLN: 1.0000 | Freq: Once | ORAL | 0 refills | Status: AC

## 2020-05-19 NOTE — Progress Notes (Signed)

## 2020-06-07 ENCOUNTER — Encounter: Payer: Self-pay | Admitting: Gastroenterology

## 2020-06-09 ENCOUNTER — Other Ambulatory Visit: Payer: Self-pay

## 2020-06-09 ENCOUNTER — Ambulatory Visit (AMBULATORY_SURGERY_CENTER): Payer: Managed Care, Other (non HMO) | Admitting: Gastroenterology

## 2020-06-09 ENCOUNTER — Encounter: Payer: Self-pay | Admitting: Gastroenterology

## 2020-06-09 VITALS — BP 110/69 | HR 55 | Temp 96.8°F | Resp 14 | Ht 71.0 in | Wt 190.6 lb

## 2020-06-09 DIAGNOSIS — Z1211 Encounter for screening for malignant neoplasm of colon: Secondary | ICD-10-CM | POA: Diagnosis not present

## 2020-06-09 DIAGNOSIS — D127 Benign neoplasm of rectosigmoid junction: Secondary | ICD-10-CM

## 2020-06-09 DIAGNOSIS — D124 Benign neoplasm of descending colon: Secondary | ICD-10-CM | POA: Diagnosis not present

## 2020-06-09 DIAGNOSIS — D128 Benign neoplasm of rectum: Secondary | ICD-10-CM | POA: Diagnosis not present

## 2020-06-09 DIAGNOSIS — K635 Polyp of colon: Secondary | ICD-10-CM

## 2020-06-09 DIAGNOSIS — D125 Benign neoplasm of sigmoid colon: Secondary | ICD-10-CM | POA: Diagnosis not present

## 2020-06-09 DIAGNOSIS — D120 Benign neoplasm of cecum: Secondary | ICD-10-CM

## 2020-06-09 MED ORDER — SODIUM CHLORIDE 0.9 % IV SOLN: 500.0000 mL | Freq: Once | INTRAVENOUS | Status: DC

## 2020-06-09 NOTE — Progress Notes (Signed)
To PACU, VSS. Report to [email or web address] 

## 2020-06-09 NOTE — Progress Notes (Signed)
Called to room to assist during endoscopic procedure.  Patient ID and intended procedure confirmed with present staff. Received instructions for my participation in the procedure from the performing physician.  

## 2020-06-09 NOTE — Patient Instructions (Signed)
° °Handouts on polyps & hemorrhoids given to you today  ° °Await pathology results on polyps removed  ° ° ° °YOU HAD AN ENDOSCOPIC PROCEDURE TODAY AT THE [facility]:   Refer to the procedure report that was given to you for any specific questions about what was found during the examination.  If the procedure report does not answer your questions, please call your gastroenterologist to clarify.  If you requested that your care partner not be given the details of your procedure findings, then the procedure report has been included in a sealed envelope for you to review at your convenience later. ° °YOU SHOULD EXPECT: Some feelings of bloating in the abdomen. Passage of more gas than usual.  Walking can help get rid of the air that was put into your GI tract during the procedure and reduce the bloating. If you had a lower endoscopy (such as a colonoscopy or flexible sigmoidoscopy) you may notice spotting of blood in your stool or on the toilet paper. If you underwent a bowel prep for your procedure, you may not have a normal bowel movement for a few days. ° °Please Note:  You might notice some irritation and congestion in your nose or some drainage.  This is from the oxygen used during your procedure.  There is no need for concern and it should clear up in a day or so. ° °SYMPTOMS TO REPORT IMMEDIATELY: ° °Following lower endoscopy (colonoscopy or flexible sigmoidoscopy): ° Excessive amounts of blood in the stool ° Significant tenderness or worsening of abdominal pains ° Swelling of the abdomen that is new, acute ° Fever of 100°F or higher ° ° °For urgent or emergent issues, a gastroenterologist can be reached at any hour by calling [phone]. °Do not use MyChart messaging for urgent concerns.  ° ° °DIET:  We do recommend a small meal at first, but then you may proceed to your regular diet.  Drink plenty of fluids but you should avoid alcoholic beverages for 24 hours. ° °ACTIVITY:  You should plan  to take it easy for the rest of today and you should NOT DRIVE or use heavy machinery until tomorrow (because of the sedation medicines used during the test).   ° °FOLLOW UP: °Our staff will call the number listed on your records 48-72 hours following your procedure to check on you and address any questions or concerns that you may have regarding the information given to you following your procedure. If we do not reach you, we will leave a message.  We will attempt to reach you two times.  During this call, we will ask if you have developed any symptoms of COVID 19. If you develop any symptoms (ie: fever, flu-like symptoms, shortness of breath, cough etc.) before then, please call [phone].  If you test positive for Covid 19 in the 2 weeks post procedure, please call and report this information to us.   ° °If any biopsies were taken you will be contacted by phone or by letter within the next 1-3 weeks.  Please call us at [phone] if you have not heard about the biopsies in 3 weeks.  ° ° °SIGNATURES/CONFIDENTIALITY: °You and/or your care partner have signed paperwork which will be entered into your electronic medical record.  These signatures attest to the fact that that the information above on your After Visit Summary has been reviewed and is understood.  Full responsibility of the confidentiality of this discharge information lies with you   your care-partner.

## 2020-06-09 NOTE — Progress Notes (Signed)
VS by   Pt's states no medical or surgical changes since previsit or office visit.  

## 2020-06-09 NOTE — Op Note (Signed)
[facility] Patient Name: Gabriel Snyder Procedure Date: 06/09/2020 9:30 AM MRN: [id number] Endoscopist: Jackquline Denmark , MD Age: 50 Referring MD:  Date of Birth: 01/19/1971 Gender: Male Account #: [id number] Procedure:                Colonoscopy Indications:              Screening for colorectal malignant neoplasm Medicines:                Monitored Anesthesia Care Procedure:                Pre-Anesthesia Assessment:                           - Prior to the procedure, a History and Physical                            was performed, and patient medications and                            allergies were reviewed. The patient's tolerance of                            previous anesthesia was also reviewed. The risks                            and benefits of the procedure and the sedation                            options and risks were discussed with the patient.                            All questions were answered, and informed consent                            was obtained. Prior Anticoagulants: The patient has                            taken no previous anticoagulant or antiplatelet                            agents. ASA Grade Assessment: I - A normal, healthy                            patient. After reviewing the risks and benefits,                            the patient was deemed in satisfactory condition to                            undergo the procedure.                           After obtaining informed consent, the colonoscope  was passed under direct vision. Throughout the                            procedure, the patient's blood pressure, pulse, and                            oxygen saturations were monitored continuously. The                            Olympus CF-HQ190L [id number]) Colonoscope was                            introduced through the anus and advanced to the 2                            cm into the ileum. The colonoscopy was  performed                            without difficulty. The patient tolerated the                            procedure well. The quality of the bowel                            preparation was good. The terminal ileum, ileocecal                            valve, appendiceal orifice, and rectum were                            photographed. Scope In: 9:35:39 AM Scope Out: 9:46:53 AM Scope Withdrawal Time: 0 hours 9 minutes 10 seconds  Total Procedure Duration: 0 hours 11 minutes 14 seconds  Findings:                 A 2 mm polyp was found in the cecum. The polyp was                            sessile. The polyp was removed with a cold biopsy                            forceps. Resection and retrieval were complete.                           Three sessile polyps were found in the rectum,                            distal sigmoid colon and mid descending colon. The                            polyps were 4 to 6 mm in size. These polyps were  removed with a cold snare. Resection and retrieval                            were complete.                           Non-bleeding internal hemorrhoids were found during                            retroflexion. The hemorrhoids were small.                           The terminal ileum appeared normal.                           The exam was otherwise without abnormality on                            direct and retroflexion views. Complications:            No immediate complications. Estimated Blood Loss:     Estimated blood loss: none. Impression:               - One 2 mm polyp in the cecum, removed with a cold                            biopsy forceps. Resected and retrieved.                           - Three 4 to 6 mm polyps in the rectum, in the                            distal sigmoid colon and in the mid descending                            colon, removed with a cold snare. Resected and                             retrieved.                           - Non-bleeding internal hemorrhoids.                           - The examined portion of the ileum was normal.                           - The examination was otherwise normal on direct                            and retroflexion views. Recommendation:           - Patient has a contact number available for                            emergencies. The signs and symptoms of potential  delayed complications were discussed with the                            patient. Return to normal activities tomorrow.                            Written discharge instructions were provided to the                            patient.                           - Resume previous diet.                           - Continue present medications.                           - Await pathology results.                           - Repeat colonoscopy for surveillance based on                            pathology results.                           - The findings and recommendations were discussed                            with the patient's family. Lynann Bologna, MD 06/09/2020 9:51:04 AM This report has been signed electronically.

## 2020-06-13 ENCOUNTER — Telehealth: Payer: Self-pay

## 2020-06-13 NOTE — Telephone Encounter (Signed)
Left message on follow up call. 

## 2020-06-13 NOTE — Telephone Encounter (Signed)
LVM

## 2020-06-29 ENCOUNTER — Encounter: Payer: Self-pay | Admitting: Gastroenterology

## 2020-10-28 ENCOUNTER — Encounter: Payer: Self-pay | Admitting: Medical-Surgical

## 2021-05-02 ENCOUNTER — Encounter: Payer: Managed Care, Other (non HMO) | Admitting: Medical-Surgical

## 2021-05-10 ENCOUNTER — Encounter: Payer: Managed Care, Other (non HMO) | Admitting: Medical-Surgical

## 2021-06-16 ENCOUNTER — Encounter: Payer: Managed Care, Other (non HMO) | Admitting: Medical-Surgical

## 2021-06-21 DIAGNOSIS — M419 Scoliosis, unspecified: Secondary | ICD-10-CM | POA: Insufficient documentation

## 2021-06-21 NOTE — Progress Notes (Unsigned)
   Complete physical exam  Patient: Gabriel Snyder   DOB: 02-10-1970   51 y.o. Male  MRN: [id number]  Subjective:    No chief complaint on file.   Gabriel Snyder is a 51 y.o. male who presents today for a complete physical exam. He reports consuming a {diet types:17450} diet. {types:19826} He generally feels {DESC; WELL/FAIRLY WELL/POORLY:18703}. He reports sleeping {DESC; WELL/FAIRLY WELL/POORLY:18703}. He {does/does not:200015} have additional problems to discuss today.    Most recent fall risk assessment:    04/01/2020    9:55 AM  Fall Risk   Falls in the past year? 0  Number falls in past yr: 0  Injury with Fall? 0  Follow up Falls evaluation completed     Most recent depression screenings:    04/01/2020   10:48 AM  PHQ 2/9 Scores  PHQ - 2 Score 0    {VISON DENTAL STD PSA (Optional):27386}  {History (Optional):23778}  Patient Care Team: Samuel Bouche, NP as PCP - General (Nurse Practitioner)   Outpatient Medications Prior to Visit  Medication Sig   cholecalciferol (VITAMIN D3) 25 MCG (1000 UNIT) tablet Take 1,000 Units by mouth daily.   zinc gluconate 50 MG tablet Take 50 mg by mouth daily.   No facility-administered medications prior to visit.   ROS     Objective:     There were no vitals taken for this visit. {Vitals History (Optional):23777}  Physical Exam   No results found for any visits on 06/22/21. {Show previous labs (optional):23779}    Assessment & Plan:    Routine Health Maintenance and Physical Exam  Immunization History  Administered Date(s) Administered   Influenza-Unspecified 11/11/2019   Moderna Sars-Covid-2 Vaccination 01/28/2019, 02/26/2019   PFIZER(Purple Top)SARS-COV-2 Vaccination 12/08/2019, 08/10/2020   Tdap 11/11/2012, 10/27/2020    Health Maintenance  Topic Date Due   Zoster Vaccines- Shingrix (1 of 2) Never done   COVID-19 Vaccine (5 - Booster for Moderna series) 10/05/2020   INFLUENZA VACCINE  08/29/2021   COLONOSCOPY  (Pts 45-77yr Insurance coverage will need to be confirmed)  06/10/2023   TETANUS/TDAP  10/28/2030   Hepatitis C Screening  Completed   HIV Screening  Completed   HPV VACCINES  Aged Out    Discussed health benefits of physical activity, and encouraged him to engage in regular exercise appropriate for his age and condition.  Problem List Items Addressed This Visit   None Visit Diagnoses     Annual physical exam    -  Primary   Need for shingles vaccine          No follow-ups on file.     JSamuel Bouche NP

## 2021-06-22 ENCOUNTER — Encounter: Payer: Self-pay | Admitting: Medical-Surgical

## 2021-06-22 ENCOUNTER — Ambulatory Visit (INDEPENDENT_AMBULATORY_CARE_PROVIDER_SITE_OTHER): Payer: Managed Care, Other (non HMO) | Admitting: Medical-Surgical

## 2021-06-22 VITALS — BP 144/84 | HR 76 | Resp 20 | Ht 71.0 in | Wt 183.6 lb

## 2021-06-22 DIAGNOSIS — Z0000 Encounter for general adult medical examination without abnormal findings: Secondary | ICD-10-CM | POA: Insufficient documentation

## 2021-06-22 DIAGNOSIS — Z833 Family history of diabetes mellitus: Secondary | ICD-10-CM

## 2021-06-22 DIAGNOSIS — B351 Tinea unguium: Secondary | ICD-10-CM

## 2021-06-22 DIAGNOSIS — Z1329 Encounter for screening for other suspected endocrine disorder: Secondary | ICD-10-CM | POA: Insufficient documentation

## 2021-06-22 DIAGNOSIS — J301 Allergic rhinitis due to pollen: Secondary | ICD-10-CM

## 2021-06-22 DIAGNOSIS — Z1379 Encounter for other screening for genetic and chromosomal anomalies: Secondary | ICD-10-CM

## 2021-06-22 DIAGNOSIS — Z125 Encounter for screening for malignant neoplasm of prostate: Secondary | ICD-10-CM | POA: Insufficient documentation

## 2021-06-22 DIAGNOSIS — Z23 Encounter for immunization: Secondary | ICD-10-CM | POA: Insufficient documentation

## 2021-06-22 HISTORY — DX: Family history of diabetes mellitus: Z83.3

## 2021-06-22 NOTE — Assessment & Plan Note (Signed)
Notes that his grandfather and several other relatives have cerebellar atrophy and he would like to be tested for this.  Referring to genetics.

## 2021-06-22 NOTE — Assessment & Plan Note (Signed)
Shingrix No. 1 given in office today.  Advised that he will need his second Shingrix in 2-6 months and it is okay to schedule this as a nurse visit.

## 2021-06-22 NOTE — Assessment & Plan Note (Signed)
Family history of prostate cancer in his grandfather so checking PSA today.

## 2021-06-22 NOTE — Assessment & Plan Note (Signed)
Checking TSH.

## 2021-06-22 NOTE — Assessment & Plan Note (Signed)
Continue Xyzal 5 mg nightly.

## 2021-06-22 NOTE — Assessment & Plan Note (Signed)
Discussed various options including topical treatments, over-the-counter remedies, Vicks vapor rub, and oral antifungals.  He would like to try the Vicks vapor rub.  Advised that he will need to use this nightly for as much as 1 full year to see a difference in the toenail fungus.

## 2021-06-22 NOTE — Assessment & Plan Note (Signed)
Checking labs as below.  Up-to-date on preventative care.  Wellness information provided with AVS.

## 2021-06-22 NOTE — Assessment & Plan Note (Signed)
Checking hemoglobin A1c. 

## 2021-06-23 LAB — COMPLETE METABOLIC PANEL WITH GFR
AG Ratio: 1.7 (calc) (ref 1.0–2.5)
ALT: 27 U/L (ref 9–46)
AST: 26 U/L (ref 10–35)
Albumin: 4.3 g/dL (ref 3.6–5.1)
Alkaline phosphatase (APISO): 89 U/L (ref 35–144)
BUN: 14 mg/dL (ref 7–25)
CO2: 26 mmol/L (ref 20–32)
Calcium: 9.6 mg/dL (ref 8.6–10.3)
Chloride: 103 mmol/L (ref 98–110)
Creat: 1.03 mg/dL (ref 0.70–1.30)
Globulin: 2.5 g/dL (calc) (ref 1.9–3.7)
Glucose, Bld: 81 mg/dL (ref 65–99)
Potassium: 4.6 mmol/L (ref 3.5–5.3)
Sodium: 139 mmol/L (ref 135–146)
Total Bilirubin: 0.5 mg/dL (ref 0.2–1.2)
Total Protein: 6.8 g/dL (ref 6.1–8.1)
eGFR: 88 mL/min/{1.73_m2} (ref >=60)

## 2021-06-23 LAB — CBC WITH DIFFERENTIAL/PLATELET
Absolute Monocytes: 612 cells/uL (ref 200–950)
Basophils Absolute: 36 cells/uL (ref 0–200)
Basophils Relative: 0.4 %
Eosinophils Absolute: 99 cells/uL (ref 15–500)
Eosinophils Relative: 1.1 %
HCT: 44.9 % (ref 38.5–50.0)
Hemoglobin: 15.6 g/dL (ref 13.2–17.1)
Lymphs Abs: 1557 cells/uL (ref 850–3900)
MCH: 31.9 pg (ref 27.0–33.0)
MCHC: 34.7 g/dL (ref 32.0–36.0)
MCV: 91.8 fL (ref 80.0–100.0)
MPV: 10.9 fL (ref 7.5–12.5)
Monocytes Relative: 6.8 %
Neutro Abs: 6696 cells/uL (ref 1500–7800)
Neutrophils Relative %: 74.4 %
Platelets: 257 10*3/uL (ref 140–400)
RBC: 4.89 10*6/uL (ref 4.20–5.80)
RDW: 11.8 % (ref 11.0–15.0)
Total Lymphocyte: 17.3 %
WBC: 9 10*3/uL (ref 3.8–10.8)

## 2021-06-23 LAB — HEMOGLOBIN A1C
Hgb A1c MFr Bld: 5.2 % of total Hgb (ref <5.7)
Mean Plasma Glucose: 103 mg/dL
eAG (mmol/L): 5.7 mmol/L

## 2021-06-23 LAB — PSA: PSA: 0.4 ng/mL (ref <=4.00)

## 2021-06-23 LAB — LIPID PANEL
Cholesterol: 216 mg/dL — ABNORMAL HIGH (ref <200)
HDL: 60 mg/dL (ref >=40)
LDL Cholesterol (Calc): 138 mg/dL (calc) — ABNORMAL HIGH
Non-HDL Cholesterol (Calc): 156 mg/dL (calc) — ABNORMAL HIGH (ref <130)
Total CHOL/HDL Ratio: 3.6 (calc) (ref <5.0)
Triglycerides: 83 mg/dL (ref <150)

## 2021-06-23 LAB — TSH: TSH: 1.61 mIU/L (ref 0.40–4.50)

## 2021-08-28 ENCOUNTER — Ambulatory Visit (INDEPENDENT_AMBULATORY_CARE_PROVIDER_SITE_OTHER): Payer: Managed Care, Other (non HMO) | Admitting: Medical-Surgical

## 2021-08-28 VITALS — Temp 98.6°F

## 2021-08-28 DIAGNOSIS — Z23 Encounter for immunization: Secondary | ICD-10-CM | POA: Diagnosis not present

## 2021-08-28 NOTE — Progress Notes (Signed)
   Established Patient Office Visit  Subjective   Patient ID: Gabriel Snyder, male    DOB: 06-Feb-1970  Age: 51 y.o. MRN: [id number]  Chief Complaint  Patient presents with   Immunizations    HPI  Gabriel Snyder is here for last shingles vaccine.   ROS    Objective:     Temp 98.6 F (37 C) (Oral)    Physical Exam   No results found for any visits on 08/28/21.    The 10-year ASCVD risk score (Arnett DK, et al., 2019) is: 4.8%    Assessment & Plan:  Shingles vaccine - Patient tolerated injection well without complications.   Problem List Items Addressed This Visit       Unprioritized   Need for shingles vaccine - Primary   Relevant Orders   Varicella-zoster vaccine IM (Completed)    No follow-ups on file.    Lavell Luster, [place]

## 2021-08-28 NOTE — Progress Notes (Signed)
Agree with documentation as below.  ___________________________________________ Ty Oshima L. Ayaan Ringle, DNP, APRN, FNP-BC [facility]  

## 2022-04-05 ENCOUNTER — Encounter: Payer: Self-pay | Admitting: Emergency Medicine

## 2022-04-05 ENCOUNTER — Ambulatory Visit
Admission: EM | Admit: 2022-04-05 | Discharge: 2022-04-05 | Disposition: A | Discharge status: Home / Self Care | Payer: Managed Care, Other (non HMO) | Attending: Internal Medicine | Admitting: Internal Medicine

## 2022-04-05 DIAGNOSIS — J209 Acute bronchitis, unspecified: Secondary | ICD-10-CM

## 2022-04-05 DIAGNOSIS — J029 Acute pharyngitis, unspecified: Secondary | ICD-10-CM | POA: Diagnosis not present

## 2022-04-05 DIAGNOSIS — J019 Acute sinusitis, unspecified: Secondary | ICD-10-CM | POA: Diagnosis not present

## 2022-04-05 MED ORDER — FLUTICASONE PROPIONATE 50 MCG/ACT NA SUSP: 1.0000 | Freq: Every day | NASAL | 2 refills | Status: DC | PRN

## 2022-04-05 MED ORDER — GUAIFENESIN 100 MG/5ML PO LIQD: 200.0000 mg | ORAL | 0 refills | Status: DC | PRN

## 2022-04-05 NOTE — ED Provider Notes (Signed)
[facility]  Note:  This document was prepared using Dragon voice recognition software and may include unintentional dictation errors.  MRN: [id number] DOB: December 10, 1970 DATE: 04/05/22   Subjective:  Chief Complaint:  Chief Complaint  Patient presents with   Sore Throat     HPI: Gabriel Snyder is a 52 y.o. male presenting for productive cough and sore throat.  He reports symptoms for the past week.  Sore throat only occurs at night.  Denies fever, otalgia, shortness of breath or difficulty breathing. Endorses nasal congestion.  He states he had some relief when taking over-the-counter Xyzal.  Presents NAD.  No current facility-administered medications for this encounter.  Current Outpatient Medications:    cholecalciferol (VITAMIN D3) 25 MCG (1000 UNIT) tablet, Take 1,000 Units by mouth daily., Disp: , Rfl:    fluticasone (FLONASE) 50 MCG/ACT nasal spray, Place 1 spray into both nostrils daily as needed for allergies or rhinitis., Disp: 15.8 mL, Rfl: 2   guaiFENesin (ROBITUSSIN) 100 MG/5ML liquid, Take 10 mLs (200 mg total) by mouth every 4 (four) hours as needed for cough or to loosen phlegm., Disp: 180 mL, Rfl: 0   zinc gluconate 50 MG tablet, Take 50 mg by mouth daily., Disp: , Rfl:    Allergies  Allergen Reactions   Other     Cat dander:  Nasal congestion  Pollen: sinus congestion, watery eyes    History:   Past Medical History:  Diagnosis Date   Allergy    Hyperlipidemia    No pertinent past medical history    No pertinent past medical history    Seasonal allergies      Past Surgical History:  Procedure Laterality Date   ORIF RADIAL FRACTURE  02/14/2011   Procedure: OPEN REDUCTION INTERNAL FIXATION (ORIF) RADIAL FRACTURE;  Surgeon: Wynonia Sours, MD;  Location: [place];  Service: Orthopedics;  Laterality: Right;  right radial head, possible radial head replacement   REFRACTIVE SURGERY     WISDOM TOOTH EXTRACTION      Family  History  Problem Relation Age of Onset   Hypertension Other    Stroke Other    Colon cancer Neg Hx    Colon polyps Neg Hx    Esophageal cancer Neg Hx    Rectal cancer Neg Hx    Stomach cancer Neg Hx     Social History   Tobacco Use   Smoking status: Never   Smokeless tobacco: Former  Scientific laboratory technician Use: Never used  Substance Use Topics   Alcohol use: Yes    Comment: occ   Drug use: Never    Review of Systems  Constitutional:  Negative for chills and fever.  HENT:  Positive for congestion and sore throat. Negative for ear pain.   Respiratory:  Positive for cough and sputum production. Negative for shortness of breath.   Gastrointestinal:  Negative for abdominal pain, nausea and vomiting.    Objective:   Vitals: BP 132/88 (BP Location: Right Arm)   Pulse 80   Temp 98.3 F (36.8 C) (Oral)   Resp 18   Ht '5\' 11"'$  (1.803 m)   Wt 187 lb (84.8 kg)   SpO2 98%   BMI 26.08 kg/m   Physical Exam Constitutional:      General: He is not in acute distress.    Appearance: Normal appearance. He is well-developed and normal weight. He is not ill-appearing or toxic-appearing.  HENT:     Head:  Normocephalic and atraumatic.     Right Ear: Tympanic membrane and ear canal normal.     Left Ear: Tympanic membrane and ear canal normal.     Mouth/Throat:     Pharynx: Oropharynx is clear. Uvula midline. No posterior oropharyngeal erythema.     Tonsils: No tonsillar exudate or tonsillar abscesses.     Comments: Bifurcated uvula Cardiovascular:     Rate and Rhythm: Normal rate and regular rhythm.     Heart sounds: Normal heart sounds.  Pulmonary:     Effort: Pulmonary effort is normal.     Breath sounds: Normal breath sounds.     Comments: Clear to auscultation bilaterally  Abdominal:     General: Bowel sounds are normal.     Palpations: Abdomen is soft.     Tenderness: There is no abdominal tenderness.  Skin:    General: Skin is warm and dry.  Neurological:     General:  No focal deficit present.     Mental Status: He is alert.  Psychiatric:        Mood and Affect: Mood and affect normal.     Results:  Labs: No results found for this or any previous visit (from the past 24 hour(s)).  Radiology: No results found.   UC Course/Treatments:  Procedures: Procedures   Medications Ordered in UC: Medications - No data to display   Assessment and Plan :     ICD-10-CM   1. Acute non-recurrent sinusitis, unspecified location  J01.90     2. Acute bronchitis, unspecified organism  J20.9     3. Acute pharyngitis, unspecified etiology  J02.9       Acute non-recurrent sinusitis, unspecified location Afebrile, nontoxic-appearing, NAD. VSS. DDX includes but not limited to: viral sinusitis, bacterial, allergic No labs were performed in office today due to length of patient's symptoms.  Suspect viral etiology.  Symptomatic treatment was recommended.  Flonase and Guaifenesin was prescribed. Strict ED precautions were given and patient verbalized understanding.  Acute bronchitis, unspecified organism DDX includes but not limited to: viral, bacterial, allergic No labs were performed in office today due to length of patient's symptoms.  Suspect viral etiology.  Symptomatic treatment was recommended.  Flonase and Guaifenesin was prescribed. Strict ED precautions were given and patient verbalized understanding.  Acute pharyngitis, unspecified etiology DDX includes but not limited to: viral, bacterial, allergic No labs were performed in office today due to length of patient's symptoms.  Suspect viral etiology.  Symptomatic treatment was recommended.  Flonase and Guaifenesin was prescribed. Strict ED precautions were given and patient verbalized understanding.   ED Discharge Orders          Ordered    fluticasone (FLONASE) 50 MCG/ACT nasal spray  Daily PRN        04/05/22 0916    guaiFENesin (ROBITUSSIN) 100 MG/5ML liquid  Every 4 hours PRN        04/05/22  0929             PDMP not reviewed this encounter.    Zenaida Tesar P, PA-C 04/05/22 1017

## 2022-04-05 NOTE — Discharge Instructions (Addendum)
Your symptoms appear consistent with a viral respiratory illness. Recommend symptomatic treatment with nasal spray (Flonase) for congestion and antihistamine. Tylenol/Ibuprofen as needed for aches. I have sent an RX for a nasal spray (Flonase) and cough syrup. Take as directed. Return in 2 to 3 days if no improvement. New or worsening symptoms such as presistent fevers, difficulty breathing, shortness of breath, or chest pain, go directly to the ER.

## 2022-04-05 NOTE — ED Triage Notes (Signed)
Patient c/o sore throat, nasal drainage in throat causing a cough, low grade fever x 1 week.  Patient has taken Ibuprofen and Xyzal.

## 2022-06-25 ENCOUNTER — Encounter: Payer: Self-pay | Admitting: Medical-Surgical

## 2022-06-26 ENCOUNTER — Encounter: Payer: Managed Care, Other (non HMO) | Admitting: Medical-Surgical

## 2022-06-26 NOTE — Progress Notes (Unsigned)
   Complete physical exam  Patient: Gabriel Snyder   DOB: February 16, 1970   52 y.o. Male  MRN: [id number]  Subjective:    No chief complaint on file.   Gabriel Snyder is a 52 y.o. male who presents today for a complete physical exam. He reports consuming a {diet types:17450} diet. {types:19826} He generally feels {DESC; WELL/FAIRLY WELL/POORLY:18703}. He reports sleeping {DESC; WELL/FAIRLY WELL/POORLY:18703}. He {does/does not:200015} have additional problems to discuss today.    Most recent fall risk assessment:    06/22/2021    1:34 PM  Fall Risk   Falls in the past year? 0  Number falls in past yr: 0  Injury with Fall? 0  Risk for fall due to : No Fall Risks  Follow up Falls evaluation completed     Most recent depression screenings:    06/22/2021    1:34 PM 04/01/2020   10:48 AM  PHQ 2/9 Scores  PHQ - 2 Score 0 0    {VISON DENTAL STD PSA (Optional):27386}  {History (Optional):23778}  Patient Care Team: Christen Butter, NP as PCP - General (Nurse Practitioner)   Outpatient Medications Prior to Visit  Medication Sig   cholecalciferol (VITAMIN D3) 25 MCG (1000 UNIT) tablet Take 1,000 Units by mouth daily.   fluticasone (FLONASE) 50 MCG/ACT nasal spray Place 1 spray into both nostrils daily as needed for allergies or rhinitis.   guaiFENesin (ROBITUSSIN) 100 MG/5ML liquid Take 10 mLs (200 mg total) by mouth every 4 (four) hours as needed for cough or to loosen phlegm.   zinc gluconate 50 MG tablet Take 50 mg by mouth daily.   No facility-administered medications prior to visit.    ROS        Objective:     There were no vitals taken for this visit. {Vitals History (Optional):23777}  Physical Exam   No results found for any visits on 06/26/22. {Show previous labs (optional):23779}    Assessment & Plan:    Routine Health Maintenance and Physical Exam  Immunization History  Administered Date(s) Administered   Influenza-Unspecified 11/11/2019   Moderna  Sars-Covid-2 Vaccination 01/28/2019, 02/26/2019   PFIZER Comirnaty(Gray Top)Covid-19 Tri-Sucrose Vaccine 08/10/2020   PFIZER(Purple Top)SARS-COV-2 Vaccination 12/08/2019   Tdap 11/11/2012, 10/27/2020   Zoster Recombinat (Shingrix) 06/22/2021, 08/28/2021    Health Maintenance  Topic Date Due   COVID-19 Vaccine (5 - 2023-24 season) 09/29/2021   INFLUENZA VACCINE  08/30/2022   Colonoscopy  06/10/2023   DTaP/Tdap/Td (3 - Td or Tdap) 10/28/2030   Hepatitis C Screening  Completed   HIV Screening  Completed   Zoster Vaccines- Shingrix  Completed   HPV VACCINES  Aged Out    Discussed health benefits of physical activity, and encouraged him to engage in regular exercise appropriate for his age and condition.  Problem List Items Addressed This Visit       Other   Annual physical exam - Primary   Prostate cancer screening   Other Visit Diagnoses     Lipid screening          No follow-ups on file.     Christen Butter, NP

## 2022-10-08 ENCOUNTER — Ambulatory Visit: Payer: Managed Care, Other (non HMO) | Admitting: Medical-Surgical

## 2022-10-09 ENCOUNTER — Encounter: Payer: Self-pay | Admitting: Medical-Surgical

## 2022-10-09 ENCOUNTER — Ambulatory Visit: Payer: Managed Care, Other (non HMO)

## 2022-10-09 ENCOUNTER — Ambulatory Visit (INDEPENDENT_AMBULATORY_CARE_PROVIDER_SITE_OTHER): Payer: Managed Care, Other (non HMO) | Admitting: Medical-Surgical

## 2022-10-09 VITALS — BP 128/80 | HR 58 | Resp 20 | Ht 71.0 in | Wt 193.0 lb

## 2022-10-09 DIAGNOSIS — Z833 Family history of diabetes mellitus: Secondary | ICD-10-CM

## 2022-10-09 DIAGNOSIS — H6123 Impacted cerumen, bilateral: Secondary | ICD-10-CM | POA: Diagnosis not present

## 2022-10-09 DIAGNOSIS — E782 Mixed hyperlipidemia: Secondary | ICD-10-CM | POA: Diagnosis not present

## 2022-10-09 DIAGNOSIS — Z0000 Encounter for general adult medical examination without abnormal findings: Secondary | ICD-10-CM | POA: Diagnosis not present

## 2022-10-09 DIAGNOSIS — Z23 Encounter for immunization: Secondary | ICD-10-CM | POA: Diagnosis not present

## 2022-10-09 DIAGNOSIS — K4090 Unilateral inguinal hernia, without obstruction or gangrene, not specified as recurrent: Secondary | ICD-10-CM | POA: Diagnosis not present

## 2022-10-09 DIAGNOSIS — Z1329 Encounter for screening for other suspected endocrine disorder: Secondary | ICD-10-CM

## 2022-10-09 DIAGNOSIS — R1909 Other intra-abdominal and pelvic swelling, mass and lump: Secondary | ICD-10-CM | POA: Diagnosis not present

## 2022-10-09 DIAGNOSIS — Z125 Encounter for screening for malignant neoplasm of prostate: Secondary | ICD-10-CM

## 2022-10-09 NOTE — Progress Notes (Signed)
Complete physical exam  Patient: Gabriel Snyder   DOB: [birth year]   52 y.o. Male  MRN: [id number]  Subjective:    Chief Complaint  Patient presents with   Annual Exam    Gabriel Snyder is a 52 y.o. male who presents today for a complete physical exam. He reports consuming a general diet.  Doing cardio and weights at least three times weekly.  He generally feels well. He reports sleeping fairly well. He does not have additional problems to discuss today.    Most recent fall risk assessment:    06/22/2021    1:34 PM  Fall Risk   Falls in the past year? 0  Number falls in past yr: 0  Injury with Fall? 0  Risk for fall due to : No Fall Risks  Follow up Falls evaluation completed     Most recent depression screenings:    10/09/2022   10:12 AM 06/22/2021    1:34 PM  PHQ 2/9 Scores  PHQ - 2 Score 0 0   Vision:Within last year, Dental: No current dental problems and Receives regular dental care, STD: The patient denies history of sexually transmitted disease., and PSA: Agrees to PSA testing    Patient Care Team: Christen Butter, NP as PCP - General (Nurse Practitioner)   Outpatient Medications Prior to Visit  Medication Sig   cholecalciferol (VITAMIN D3) 25 MCG (1000 UNIT) tablet Take 1,000 Units by mouth daily.   zinc gluconate 50 MG tablet Take 50 mg by mouth daily.   [DISCONTINUED] fluticasone (FLONASE) 50 MCG/ACT nasal spray Place 1 spray into both nostrils daily as needed for allergies or rhinitis.   [DISCONTINUED] guaiFENesin (ROBITUSSIN) 100 MG/5ML liquid Take 10 mLs (200 mg total) by mouth every 4 (four) hours as needed for cough or to loosen phlegm.   No facility-administered medications prior to visit.   Review of Systems  Constitutional:  Negative for chills, fever, malaise/fatigue and weight loss.  HENT:  Negative for congestion, ear pain, hearing loss, sinus pain and sore throat.   Eyes:  Negative for blurred vision, photophobia and pain.  Respiratory:  Negative  for cough, shortness of breath and wheezing.   Cardiovascular:  Negative for chest pain, palpitations and leg swelling.  Gastrointestinal:  Positive for constipation. Negative for abdominal pain, diarrhea, heartburn, nausea and vomiting.  Genitourinary:  Negative for dysuria, frequency and urgency.       Nocturia  Musculoskeletal:  Negative for falls and neck pain.  Skin:  Negative for itching and rash.  Neurological:  Negative for dizziness, weakness and headaches.  Endo/Heme/Allergies:  Negative for polydipsia. Does not bruise/bleed easily.  Psychiatric/Behavioral:  Negative for depression, substance abuse and suicidal ideas. The patient is not nervous/anxious.      Objective:    BP 128/80 (BP Location: Right Arm, Cuff Size: Normal)   Pulse (!) 58   Resp 20   Ht 5\' 11"  (1.803 m)   Wt 193 lb 0.6 oz (87.6 kg)   SpO2 100%   BMI 26.92 kg/m    Physical Exam Vitals reviewed. Exam conducted with a chaperone present.  Constitutional:      General: He is not in acute distress.    Appearance: Normal appearance. He is not ill-appearing.  HENT:     Head: Normocephalic and atraumatic.     Right Ear: External ear normal. There is impacted cerumen.     Left Ear: External ear normal. There is impacted cerumen.     Nose:  Nose normal. No congestion or rhinorrhea.     Mouth/Throat:     Mouth: Mucous membranes are moist.     Pharynx: No oropharyngeal exudate or posterior oropharyngeal erythema.  Eyes:     General: No scleral icterus.       Right eye: No discharge.        Left eye: No discharge.     Extraocular Movements: Extraocular movements intact.     Conjunctiva/sclera: Conjunctivae normal.     Pupils: Pupils are equal, round, and reactive to light.  Neck:     Thyroid: No thyromegaly.     Vascular: No carotid bruit or JVD.     Trachea: Trachea normal.  Cardiovascular:     Rate and Rhythm: Normal rate and regular rhythm.     Pulses: Normal pulses.     Heart sounds: Normal heart  sounds. No murmur heard.    No friction rub. No gallop.  Pulmonary:     Effort: Pulmonary effort is normal. No respiratory distress.     Breath sounds: Normal breath sounds. No wheezing.  Abdominal:     General: Bowel sounds are normal. There is no distension.     Palpations: Abdomen is soft.     Tenderness: There is no abdominal tenderness. There is no guarding.  Genitourinary:    Penis: Normal.      Comments: Intermittent issues with right groin bulge.  Not present on exam today. Musculoskeletal:        General: Normal range of motion.     Cervical back: Normal range of motion and neck supple.  Lymphadenopathy:     Cervical: No cervical adenopathy.  Skin:    General: Skin is warm and dry.  Neurological:     Mental Status: He is alert and oriented to person, place, and time.     Cranial Nerves: No cranial nerve deficit.  Psychiatric:        Mood and Affect: Mood normal.        Behavior: Behavior normal.        Thought Content: Thought content normal.        Judgment: Judgment normal.     No results found for any visits on 10/09/22.     Assessment & Plan:    Routine Health Maintenance and Physical Exam  Immunization History  Administered Date(s) Administered   Influenza-Unspecified 11/11/2019   Moderna Sars-Covid-2 Vaccination 01/28/2019, 02/26/2019   PFIZER Comirnaty(Gray Top)Covid-19 Tri-Sucrose Vaccine 08/10/2020   PFIZER(Purple Top)SARS-COV-2 Vaccination 12/08/2019   Tdap 11/11/2012, 10/27/2020   Zoster Recombinant(Shingrix) 06/22/2021, 08/28/2021    Health Maintenance  Topic Date Due   INFLUENZA VACCINE  08/30/2022   COVID-19 Vaccine (5 - 2023-24 season) 09/30/2022   Colonoscopy  06/10/2023   DTaP/Tdap/Td (3 - Td or Tdap) 10/28/2030   Hepatitis C Screening  Completed   HIV Screening  Completed   Zoster Vaccines- Shingrix  Completed   HPV VACCINES  Aged Out    Discussed health benefits of physical activity, and encouraged him to engage in regular exercise  appropriate for his age and condition.  1. Annual physical exam Checking labs as below.  Up-to-date on preventative care.  Wellness information provided with AVS. - CBC with Differential/Platelet - CMP14+EGFR  2. Need for influenza vaccination Flu vaccine given in office today. - Flu vaccine trivalent PF, 6mos and older(Flulaval,Afluria,Fluarix,Fluzone)  3. Need for COVID-19 vaccine COVID-vaccine given in office today. - Pfizer Comirnaty Covid -19 Vaccine 18yrs and older  4. Family history of diabetes  mellitus Checking A1c. - Hemoglobin A1c  5. Thyroid disorder screen Checking TSH. - TSH  6. Prostate cancer screening Checking PSA. - PSA  7. Mixed hyperlipidemia Checking lipids. - Lipid panel  8. Bulge in groin area Patient reports intermittent right groin bulge that he suspects is an inguinal hernia.  No pain, redness, swelling, tenderness.  When the bulge happens, it is soft and reducible.  Not present on exam today.  Getting ultrasound of the pelvis for further evaluation. - US Pelvis Limited; Future  9. Bilateral impacted cerumen On exam, bilateral ear canals obstructed with cerumen. Indication: Cerumen impaction of the ear(s) Medical necessity statement: On physical examination, cerumen impairs clinically significant portions of the external auditory canal, and tympanic membrane. Noted obstructive, copious cerumen that cannot be removed without magnification and instrumentations  Consent: Discussed benefits and risks of procedure and verbal consent obtained Procedure: Patient was prepped for the procedure. Utilized an otoscope to assess and take note of the ear canal, the tympanic membrane, and the presence, amount, and placement of the cerumen. Gentle water irrigation and soft plastic curette was utilized to remove cerumen.  Post procedure examination: shows cerumen was completely removed. Patient tolerated procedure well. The patient is made aware that they may  experience temporary vertigo, temporary hearing loss, and temporary discomfort. If these symptom last for more than 24 hours to call the clinic or proceed to the ED.  Return in about 1 year (around 10/09/2023) for annual physical exam or sooner if needed.   Christen Butter, NP

## 2022-10-10 LAB — CMP14+EGFR
ALT: 25 IU/L (ref 0–44)
AST: 26 IU/L (ref 0–40)
Albumin: 4.4 g/dL (ref 3.8–4.9)
Alkaline Phosphatase: 96 IU/L (ref 44–121)
BUN/Creatinine Ratio: 15 (ref 9–20)
BUN: 14 mg/dL (ref 6–24)
Bilirubin Total: 0.5 mg/dL (ref 0.0–1.2)
CO2: 23 mmol/L (ref 20–29)
Calcium: 9.5 mg/dL (ref 8.7–10.2)
Chloride: 102 mmol/L (ref 96–106)
Creatinine, Ser: 0.96 mg/dL (ref 0.76–1.27)
Globulin, Total: 2.7 g/dL (ref 1.5–4.5)
Glucose: 75 mg/dL (ref 70–99)
Potassium: 4.8 mmol/L (ref 3.5–5.2)
Sodium: 139 mmol/L (ref 134–144)
Total Protein: 7.1 g/dL (ref 6.0–8.5)
eGFR: 95 mL/min/{1.73_m2} (ref >59)

## 2022-10-10 LAB — CBC WITH DIFFERENTIAL/PLATELET
Basophils Absolute: 0.1 10*3/uL (ref 0.0–0.2)
Basos: 1 %
EOS (ABSOLUTE): 0.2 10*3/uL (ref 0.0–0.4)
Eos: 4 %
Hematocrit: 48 % (ref 37.5–51.0)
Hemoglobin: 16.2 g/dL (ref 13.0–17.7)
Immature Grans (Abs): 0 10*3/uL (ref 0.0–0.1)
Immature Granulocytes: 0 %
Lymphocytes Absolute: 1.7 10*3/uL (ref 0.7–3.1)
Lymphs: 27 %
MCH: 31.5 pg (ref 26.6–33.0)
MCHC: 33.8 g/dL (ref 31.5–35.7)
MCV: 93 fL (ref 79–97)
Monocytes Absolute: 0.6 10*3/uL (ref 0.1–0.9)
Monocytes: 9 %
Neutrophils Absolute: 3.7 10*3/uL (ref 1.4–7.0)
Neutrophils: 59 %
Platelets: 243 10*3/uL (ref 150–450)
RBC: 5.15 x10E6/uL (ref 4.14–5.80)
RDW: 12.4 % (ref 11.6–15.4)
WBC: 6.3 10*3/uL (ref 3.4–10.8)

## 2022-10-10 LAB — LIPID PANEL
Chol/HDL Ratio: 4.4 ratio (ref 0.0–5.0)
Cholesterol, Total: 231 mg/dL — ABNORMAL HIGH (ref 100–199)
HDL: 53 mg/dL (ref >39)
LDL Chol Calc (NIH): 159 mg/dL — ABNORMAL HIGH (ref 0–99)
Triglycerides: 106 mg/dL (ref 0–149)
VLDL Cholesterol Cal: 19 mg/dL (ref 5–40)

## 2022-10-10 LAB — HEMOGLOBIN A1C
Est. average glucose Bld gHb Est-mCnc: 120 mg/dL
Hgb A1c MFr Bld: 5.8 % — ABNORMAL HIGH (ref 4.8–5.6)

## 2022-10-10 LAB — PSA: Prostate Specific Ag, Serum: 0.6 ng/mL (ref 0.0–4.0)

## 2022-10-10 LAB — TSH: TSH: 1.36 u[IU]/mL (ref 0.450–4.500)

## 2022-10-22 ENCOUNTER — Encounter: Payer: Self-pay | Admitting: Medical-Surgical

## 2022-10-22 DIAGNOSIS — K4090 Unilateral inguinal hernia, without obstruction or gangrene, not specified as recurrent: Secondary | ICD-10-CM

## 2022-10-22 DIAGNOSIS — E782 Mixed hyperlipidemia: Secondary | ICD-10-CM

## 2022-10-23 MED ORDER — ATORVASTATIN CALCIUM 20 MG PO TABS: 20.0000 mg | ORAL_TABLET | Freq: Every day | ORAL | 3 refills | Status: DC

## 2022-11-12 ENCOUNTER — Other Ambulatory Visit: Payer: Self-pay | Admitting: Surgery

## 2022-12-03 ENCOUNTER — Other Ambulatory Visit: Payer: Self-pay

## 2022-12-03 ENCOUNTER — Encounter (HOSPITAL_BASED_OUTPATIENT_CLINIC_OR_DEPARTMENT_OTHER): Payer: Self-pay | Admitting: Surgery

## 2022-12-05 MED ORDER — ENSURE PRE-SURGERY PO LIQD: 296.0000 mL | Freq: Once | ORAL | Status: DC

## 2022-12-05 MED ORDER — CHLORHEXIDINE GLUCONATE CLOTH 2 % EX PADS: 6.0000 | MEDICATED_PAD | Freq: Once | CUTANEOUS | Status: DC

## 2022-12-05 NOTE — Progress Notes (Signed)

## 2022-12-10 NOTE — H&P (Signed)
REFERRING PHYSICIAN: Christen Butter, NP PROVIDER: Wayne Both, MD MRN: [id number] DOB: 1970-07-04  Subjective   Chief Complaint: Inguinal Hernia  History of Present Illness: Gabriel Snyder is a 52 y.o. male who is seen today as an office consultation for evaluation of Inguinal Hernia  This is a pleasant 52 year old gentleman who is referred here for evaluation of a right inguinal hernia. 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 He has no pain or any kind of discomfort or obstructive symptoms. He reports that the bulge will easily reduce. He is otherwise healthy without complaints.  Review of Systems: A complete review of systems was obtained from the patient. I have reviewed this information and discussed as appropriate with the patient. See HPI as well for other ROS.  ROS   Medical History: Past Medical History:  Diagnosis Date  Allergic state  GERD (gastroesophageal reflux disease) last year  History of pneumonia  Hx MRSA infection 2014 and 2015  Scoliosis  Sleep apnea 2012   Patient Active Problem List  Diagnosis  Scoliosis  Onychomycosis  Postnasal drip  Post-nasal drip  Impacted cerumen of both ears  Acute bronchitis  GERD without esophagitis  Infection of skin due to methicillin resistant Staphylococcus aureus (MRSA)  Seasonal allergic rhinitis  Tinea pedis   Past Surgical History:  Procedure Laterality Date  ARTHROPLASTY RADIAL HEAD W/ORIF Right 01/2011  FRACTURE SURGERY 2014  Radial head    Allergies  Allergen Reactions  Cat Dander Other (See Comments)  Nasal congestion  Pollen, Micronized Other (See Comments)  Sinus congestion, watery eyes   Current Outpatient Medications on File Prior to Visit  Medication Sig Dispense Refill  atorvastatin (LIPITOR) 20 MG tablet Take 20 mg by mouth once daily  CETIRIZINE HCL (ZYRTEC ORAL) Take by mouth.    No current facility-administered medications on file prior to visit.   Family History  Problem Relation Age of Onset  Diabetes Mother  Hyperlipidemia (Elevated cholesterol) Mother  High blood pressure (Hypertension) Mother  Hyperlipidemia (Elevated cholesterol) Father  High blood pressure (Hypertension) Father  Hyperlipidemia (Elevated cholesterol) Brother  Skin cancer Maternal Grandmother  High blood pressure (Hypertension) Maternal Grandmother  High blood pressure (Hypertension) Maternal Grandfather  High blood pressure (Hypertension) Paternal Grandmother  High blood pressure (Hypertension) Paternal Grandfather  Prostate cancer Paternal Grandfather  Peripheral vascular disease Paternal Grandfather  Chronic kidney disease Paternal Grandfather  Urinary Incontinence Paternal Grandfather  Reflux disease Paternal Grandfather  Emphysema Paternal Grandfather  Diverticulosis Paternal Grandfather    Social History   Tobacco Use  Smoking Status Never  Smokeless Tobacco Former  Types: Snuff, Chew  Quit date: 07/11/1989    Social History   Socioeconomic History  Marital status: Married  Tobacco Use  Smoking status: Never  Smokeless tobacco: Former  Types: Snuff, Chew  Quit date: 07/11/1989  Substance and Sexual Activity  Alcohol use: No  Alcohol/week: 0.0 standard drinks of alcohol  Drug use: No  Sexual activity: Never  Partners: Female  Other Topics Concern  Would you please tell us about the people who live in your home, your pets, or anything else important to your social life? Yes  Social History Narrative  Married, works as a IT sales professional. He has a 28 yo son as of 08/2013   Social Drivers of Health   Financial Resource Strain: Medium Risk (10/08/2022)  Received from [facility]  Overall Financial Resource  Strain (CARDIA)  Difficulty of Paying Living Expenses: Somewhat hard  Food Insecurity: No Food Insecurity (10/08/2022)  Received from [facility]  Hunger Vital  Sign  Worried About Running Out of Food in the Last Year: Never true  Ran Out of Food in the Last Year: Never true  Transportation Needs: No Transportation Needs (10/08/2022)  Received from [facility] - Transportation  Lack of Transportation (Medical): No  Lack of Transportation (Non-Medical): No  Physical Activity: Insufficiently Active (10/08/2022)  Received from [facility]  Exercise Vital Sign  Days of Exercise per Week: 3 days  Minutes of Exercise per Session: 40 min  Stress: No Stress Concern Present (10/08/2022)  Received from [facility] - Occupational Stress Questionnaire  Feeling of Stress : Not at all  Social Connections: Moderately Integrated (10/08/2022)  Received from [facility]  Social Connection and Isolation Panel [NHANES]  Frequency of Communication with Friends and Family: More than three times a week  Frequency of Social Gatherings with Friends and Family: Once a week  Attends Religious Services: More than 4 times per year  Active Member of Clubs or Organizations: No  Marital Status: Married   Objective:   Vitals:   BP: 130/80  Pulse: 79  Temp: 36.7 C (98.1 F)  SpO2: 98%  Weight: 87.5 kg (193 lb)  Height: 180.3 cm (5\' 11" )  PainSc: 0-No pain   Body mass index is 26.92 kg/m.  Physical Exam   He appears well on exam.  His abdomen is soft and nontender. He has a small, easily reducible right inguinal hernia  Labs, Imaging and Diagnostic Testing: I reviewed his notes in the electronic medical records  Assessment and Plan:   Diagnoses and all orders for this visit:  Right inguinal hernia   I discussed abdominal wall anatomy with the patient and his wife. We discussed continued conservative management without surgery versus surgery. As he does a lot of heavy lifting and the hernia is now visible and likely getting larger, repair should be considered. I discussed both the laparoscopic and open  techniques as well as use of mesh. They are concerned about permanent mesh as a members had previous issues regarding mesh. We discussed suture repair alone and the high risk of recurrence versus using a biologic mesh. We had a long discussion regarding the open and laparoscopic techniques in detail. We discussed the various uses of mesh and the risks. We discussed the risk of surgery in detail which includes but is not limited to bleeding, infection, injury to surrounding structures, nerve entrapment, chronic pain, hernia recurrence, postoperative recovery, etc. At this point after long discussion they wish to proceed with an open repair with mesh. They will still decide whether he would like a permanent mesh or biologic mesh but are leaning down toward a permanent mesh. Surgery will be scheduled.

## 2022-12-11 ENCOUNTER — Ambulatory Visit (HOSPITAL_BASED_OUTPATIENT_CLINIC_OR_DEPARTMENT_OTHER): Payer: Managed Care, Other (non HMO) | Admitting: Anesthesiology

## 2022-12-11 ENCOUNTER — Other Ambulatory Visit: Payer: Self-pay

## 2022-12-11 ENCOUNTER — Ambulatory Visit (HOSPITAL_BASED_OUTPATIENT_CLINIC_OR_DEPARTMENT_OTHER): Payer: Self-pay | Admitting: Anesthesiology

## 2022-12-11 ENCOUNTER — Encounter (HOSPITAL_BASED_OUTPATIENT_CLINIC_OR_DEPARTMENT_OTHER): Payer: Self-pay | Admitting: Surgery

## 2022-12-11 ENCOUNTER — Encounter (HOSPITAL_BASED_OUTPATIENT_CLINIC_OR_DEPARTMENT_OTHER)
Admission: RE | Disposition: A | Discharge status: Home / Self Care | Payer: Self-pay | Source: Home / Self Care | Attending: Surgery

## 2022-12-11 ENCOUNTER — Ambulatory Visit (HOSPITAL_BASED_OUTPATIENT_CLINIC_OR_DEPARTMENT_OTHER)
Admission: RE | Admit: 2022-12-11 | Discharge: 2022-12-11 | Disposition: A | Discharge status: Home / Self Care | Payer: Managed Care, Other (non HMO) | Attending: Surgery | Admitting: Surgery

## 2022-12-11 DIAGNOSIS — K4090 Unilateral inguinal hernia, without obstruction or gangrene, not specified as recurrent: Secondary | ICD-10-CM

## 2022-12-11 HISTORY — PX: INGUINAL HERNIA REPAIR: SHX194

## 2022-12-11 SURGERY — REPAIR, HERNIA, INGUINAL, ADULT
Anesthesia: General | Site: Groin | Laterality: Right

## 2022-12-11 MED ORDER — FENTANYL CITRATE (PF) 100 MCG/2ML IJ SOLN
INTRAMUSCULAR | Status: AC
  Filled 2022-12-11: qty 2

## 2022-12-11 MED ORDER — ACETAMINOPHEN 500 MG PO TABS: 1000.0000 mg | ORAL_TABLET | Freq: Once | ORAL | Status: DC

## 2022-12-11 MED ORDER — CELECOXIB 200 MG PO CAPS
ORAL_CAPSULE | ORAL | Status: AC
  Filled 2022-12-11: qty 1

## 2022-12-11 MED ORDER — LIDOCAINE 2% (20 MG/ML) 5 ML SYRINGE
INTRAMUSCULAR | Status: AC
Start: 2022-12-11 — End: ?
  Filled 2022-12-11: qty 5

## 2022-12-11 MED ORDER — AMISULPRIDE (ANTIEMETIC) 5 MG/2ML IV SOLN: 10.0000 mg | Freq: Once | INTRAVENOUS | Status: DC | PRN

## 2022-12-11 MED ORDER — CEFAZOLIN SODIUM-DEXTROSE 2-4 GM/100ML-% IV SOLN
INTRAVENOUS | Status: AC
  Filled 2022-12-11: qty 100

## 2022-12-11 MED ORDER — ONDANSETRON HCL 4 MG/2ML IJ SOLN
INTRAMUSCULAR | Status: DC | PRN
  Administered 2022-12-11: 4 mg via INTRAVENOUS

## 2022-12-11 MED ORDER — MIDAZOLAM HCL 2 MG/2ML IJ SOLN
2.0000 mg | Freq: Once | INTRAMUSCULAR | Status: AC
  Administered 2022-12-11: 2 mg via INTRAVENOUS

## 2022-12-11 MED ORDER — PROPOFOL 10 MG/ML IV BOLUS
INTRAVENOUS | Status: DC | PRN
  Administered 2022-12-11: 200 mg via INTRAVENOUS

## 2022-12-11 MED ORDER — FENTANYL CITRATE (PF) 100 MCG/2ML IJ SOLN
25.0000 ug | INTRAMUSCULAR | Status: DC | PRN
  Administered 2022-12-11: 25 ug via INTRAVENOUS
  Administered 2022-12-11: 50 ug via INTRAVENOUS

## 2022-12-11 MED ORDER — OXYCODONE HCL 5 MG PO TABS
5.0000 mg | ORAL_TABLET | Freq: Once | ORAL | Status: AC | PRN
  Administered 2022-12-11: 5 mg via ORAL

## 2022-12-11 MED ORDER — LACTATED RINGERS IV SOLN: INTRAVENOUS | Status: DC

## 2022-12-11 MED ORDER — CELECOXIB 200 MG PO CAPS
200.0000 mg | ORAL_CAPSULE | Freq: Once | ORAL | Status: AC
  Administered 2022-12-11: 200 mg via ORAL

## 2022-12-11 MED ORDER — SODIUM CHLORIDE 0.9 % IV SOLN: 12.5000 mg | INTRAVENOUS | Status: DC | PRN

## 2022-12-11 MED ORDER — ACETAMINOPHEN 500 MG PO TABS
1000.0000 mg | ORAL_TABLET | ORAL | Status: AC
  Administered 2022-12-11: 1000 mg via ORAL

## 2022-12-11 MED ORDER — OXYCODONE HCL 5 MG PO TABS
ORAL_TABLET | ORAL | Status: AC
  Filled 2022-12-11: qty 1

## 2022-12-11 MED ORDER — FENTANYL CITRATE (PF) 100 MCG/2ML IJ SOLN
100.0000 ug | Freq: Once | INTRAMUSCULAR | Status: AC
  Administered 2022-12-11: 50 ug via INTRAVENOUS

## 2022-12-11 MED ORDER — CEFAZOLIN SODIUM-DEXTROSE 2-3 GM-%(50ML) IV SOLR
INTRAVENOUS | Status: DC | PRN
  Administered 2022-12-11: 2 g via INTRAVENOUS

## 2022-12-11 MED ORDER — OXYCODONE HCL 5 MG/5ML PO SOLN: 5.0000 mg | Freq: Once | ORAL | Status: AC | PRN

## 2022-12-11 MED ORDER — MIDAZOLAM HCL 2 MG/2ML IJ SOLN
INTRAMUSCULAR | Status: AC
Start: 2022-12-11 — End: ?
  Filled 2022-12-11: qty 2

## 2022-12-11 MED ORDER — BUPIVACAINE LIPOSOME 1.3 % IJ SUSP
INTRAMUSCULAR | Status: DC | PRN
  Administered 2022-12-11: 10 mL

## 2022-12-11 MED ORDER — BUPIVACAINE HCL (PF) 0.5 % IJ SOLN
INTRAMUSCULAR | Status: DC | PRN
  Administered 2022-12-11: 15 mL

## 2022-12-11 MED ORDER — ACETAMINOPHEN 500 MG PO TABS
ORAL_TABLET | ORAL | Status: AC
  Filled 2022-12-11: qty 2

## 2022-12-11 MED ORDER — CEFAZOLIN SODIUM-DEXTROSE 2-4 GM/100ML-% IV SOLN: 2.0000 g | INTRAVENOUS | Status: DC

## 2022-12-11 MED ORDER — DEXAMETHASONE SODIUM PHOSPHATE 10 MG/ML IJ SOLN
INTRAMUSCULAR | Status: DC | PRN
  Administered 2022-12-11: 10 mg via INTRAVENOUS

## 2022-12-11 MED ORDER — PROPOFOL 10 MG/ML IV BOLUS
INTRAVENOUS | Status: AC
  Filled 2022-12-11: qty 20

## 2022-12-11 MED ORDER — PHENYLEPHRINE HCL (PRESSORS) 10 MG/ML IV SOLN
INTRAVENOUS | Status: DC | PRN
  Administered 2022-12-11: 160 ug via INTRAVENOUS

## 2022-12-11 MED ORDER — SODIUM CHLORIDE 0.9 % IV SOLN: INTRAVENOUS | Status: DC | PRN

## 2022-12-11 MED ORDER — TRAMADOL HCL 50 MG PO TABS: 50.0000 mg | ORAL_TABLET | Freq: Four times a day (QID) | ORAL | 0 refills | Status: DC | PRN

## 2022-12-11 MED ORDER — BUPIVACAINE-EPINEPHRINE 0.5% -1:200000 IJ SOLN
INTRAMUSCULAR | Status: DC | PRN
  Administered 2022-12-11: 10 mL

## 2022-12-11 SURGICAL SUPPLY — 38 items
ADH SKN CLS APL DERMABOND .7 (GAUZE/BANDAGES/DRESSINGS) ×1
APL PRP STRL LF DISP 70% ISPRP (MISCELLANEOUS) ×1
BLADE CLIPPER SURG (BLADE) ×1 IMPLANT
BLADE SURG 15 STRL LF DISP TIS (BLADE) ×1 IMPLANT
BLADE SURG 15 STRL SS (BLADE) ×1
CHLORAPREP W/TINT 26 (MISCELLANEOUS) ×1 IMPLANT
COVER BACK TABLE 60X90IN (DRAPES) ×1 IMPLANT
COVER MAYO STAND STRL (DRAPES) ×1 IMPLANT
DERMABOND ADVANCED .7 DNX12 (GAUZE/BANDAGES/DRESSINGS) ×1 IMPLANT
DRAIN PENROSE .5X12 LATEX STL (DRAIN) ×1 IMPLANT
DRAPE LAPAROTOMY 100X72 PEDS (DRAPES) ×1 IMPLANT
DRAPE UTILITY XL STRL (DRAPES) ×1 IMPLANT
ELECT REM PT RETURN 9FT ADLT (ELECTROSURGICAL) ×1
ELECTRODE REM PT RTRN 9FT ADLT (ELECTROSURGICAL) ×1 IMPLANT
GLOVE SURG SIGNA 7.5 PF LTX (GLOVE) ×1 IMPLANT
GOWN STRL REUS W/ TWL LRG LVL3 (GOWN DISPOSABLE) ×1 IMPLANT
GOWN STRL REUS W/ TWL XL LVL3 (GOWN DISPOSABLE) ×1 IMPLANT
GOWN STRL REUS W/TWL LRG LVL3 (GOWN DISPOSABLE) ×1
GOWN STRL REUS W/TWL XL LVL3 (GOWN DISPOSABLE) ×1
MESH PARIETEX PROGRIP RIGHT (Mesh General) IMPLANT
NDL HYPO 25X1 1.5 SAFETY (NEEDLE) ×1 IMPLANT
NEEDLE HYPO 25X1 1.5 SAFETY (NEEDLE) ×1
NS IRRIG 1000ML POUR BTL (IV SOLUTION) IMPLANT
PACK BASIN DAY SURGERY FS (CUSTOM PROCEDURE TRAY) ×1 IMPLANT
PENCIL SMOKE EVACUATOR (MISCELLANEOUS) ×1 IMPLANT
SLEEVE SCD COMPRESS KNEE MED (STOCKING) ×1 IMPLANT
SPONGE INTESTINAL PEANUT (DISPOSABLE) IMPLANT
SPONGE T-LAP 4X18 ~~LOC~~+RFID (SPONGE) ×1 IMPLANT
SUT MNCRL AB 4-0 PS2 18 (SUTURE) ×1 IMPLANT
SUT SILK 2 0 SH (SUTURE) IMPLANT
SUT VIC AB 2-0 CT1 27 (SUTURE) ×2
SUT VIC AB 2-0 CT1 TAPERPNT 27 (SUTURE) ×2 IMPLANT
SUT VIC AB 3-0 CT1 27 (SUTURE) ×1
SUT VIC AB 3-0 CT1 27XBRD (SUTURE) ×1 IMPLANT
SYR BULB EAR ULCER 3OZ GRN STR (SYRINGE) IMPLANT
SYR CONTROL 10ML LL (SYRINGE) ×1 IMPLANT
TOWEL GREEN STERILE FF (TOWEL DISPOSABLE) ×1 IMPLANT
TUBE CONNECTING 20X1/4 (TUBING) IMPLANT

## 2022-12-11 NOTE — Transfer of Care (Signed)
Immediate Anesthesia Transfer of Care Note  Patient: Gabriel Snyder  Procedure(s) Performed: OPEN HERNIA REPAIR INGUINAL ADULT WITH MESH (Right: Groin)  Patient Location: PACU  Anesthesia Type:General and Regional  Level of Consciousness: awake, alert , and patient cooperative  Airway & Oxygen Therapy: Patient Spontanous Breathing and Patient connected to face mask oxygen  Post-op Assessment: Report given to RN and Post -op Vital signs reviewed and stable  Post vital signs: Reviewed and stable  Last Vitals:  Vitals Value Taken Time  BP    Temp    Pulse 75 12/11/22 1008  Resp 12 12/11/22 1008  SpO2 98 % 12/11/22 1008  Vitals shown include unfiled device data.  Last Pain:  Vitals:   12/11/22 0804  TempSrc: Oral  PainSc: 0-No pain      Patients Stated Pain Goal: 3 (12/11/22 0804)  Complications: No notable events documented.

## 2022-12-11 NOTE — Discharge Instructions (Addendum)
[facility] _______[facility]  UMBILICAL OR INGUINAL HERNIA REPAIR: POST OP INSTRUCTIONS  Always review your discharge instruction sheet given to you by the facility where your surgery was performed. IF YOU HAVE DISABILITY OR FAMILY LEAVE FORMS, YOU MUST BRING THEM TO THE OFFICE FOR PROCESSING.   DO NOT GIVE THEM TO YOUR DOCTOR.  1. A  prescription for pain medication may be given to you upon discharge.  Take your pain medication as prescribed, if needed.  If narcotic pain medicine is not needed, then you may take acetaminophen (Tylenol) or ibuprofen (Advil) as needed. 2. Take your usually prescribed medications unless otherwise directed. If you need a refill on your pain medication, please contact your pharmacy.  They will contact our office to request authorization. Prescriptions will not be filled after 5 pm or on week-ends. 3. You should follow a light diet the first 24 hours after arrival home, such as soup and crackers, etc.  Be sure to include lots of fluids daily.  Resume your normal diet the day after surgery. 4.Most patients will experience some swelling and bruising around the umbilicus or in the groin and scrotum.  Ice packs and reclining will help.  Swelling and bruising can take several days to resolve.  6. It is common to experience some constipation if taking pain medication after surgery.  Increasing fluid intake and taking a stool softener (such as Colace) will usually help or prevent this problem from occurring.  A mild laxative (Milk of Magnesia or Miralax) should be taken according to package directions if there are no bowel movements after 48 hours. 7. Unless discharge instructions indicate otherwise, you may remove your bandages 24-48 hours after surgery, and you may shower at that time.  You may have steri-strips (small skin tapes) in place directly over the incision.  These strips should be left on the skin for 7-10 days.  If your surgeon used skin glue on the  incision, you may shower in 24 hours.  The glue will flake off over the next 2-3 weeks.  Any sutures or staples will be removed at the office during your follow-up visit. 8. ACTIVITIES:  You may resume regular (light) daily activities beginning the next day--such as daily self-care, walking, climbing stairs--gradually increasing activities as tolerated.  You may have sexual intercourse when it is comfortable.  Refrain from any heavy lifting or straining until approved by your doctor.  a.You may drive when you are no longer taking prescription pain medication, you can comfortably wear a seatbelt, and you can safely maneuver your car and apply brakes. b.RETURN TO WORK:   _____________________________________________  9.You should see your doctor in the office for a follow-up appointment approximately 2-3 weeks after your surgery.  Make sure that you call for this appointment within a day or two after you arrive home to insure a convenient appointment time. 10.OTHER INSTRUCTIONS: YOU MAY SHOWER STARTING TOMORROW ICE PACK, TYLENOL, AND IBUPROFEN ALSO FOR PAIN NO LIFTING MORE THAN 15 POUNDS FOR 4 WEEKS _________________________    _____________________________________  WHEN TO CALL YOUR DOCTOR: Fever over 101.0 Inability to urinate Nausea and/or vomiting Extreme swelling or bruising Continued bleeding from incision. Increased pain, redness, or drainage from the incision  The clinic staff is available to answer your questions during regular business hours.  Please don't hesitate to call and ask to speak to one of the nurses for clinical concerns.  If you have a medical emergency, go to the nearest emergency room or call 911.  A surgeon from [facility] is always on call at the hospital   [address], [address], [place], [place]  [address] ?  [address] [address], [place], [place]   [address] [phone] ? [phone] ? FAX [phone] Web site:  [email or web address]  May take Tylenol after 2pm, if needed.  May take NSAIDS (ibuprofen/motrin) after 2pm, if needed.     Post Anesthesia Home Care Instructions  Activity: Get plenty of rest for the remainder of the day. A responsible individual must stay with you for 24 hours following the procedure.  For the next 24 hours, DO NOT: -Drive a car -Advertising copywriter -Drink alcoholic beverages -Take any medication unless instructed by your physician -Make any legal decisions or sign important papers.  Meals: Start with liquid foods such as gelatin or soup. Progress to regular foods as tolerated. Avoid greasy, spicy, heavy foods. If nausea and/or vomiting occur, drink only clear liquids until the nausea and/or vomiting subsides. Call your physician if vomiting continues.  Special Instructions/Symptoms: Your throat may feel dry or sore from the anesthesia or the breathing tube placed in your throat during surgery. If this causes discomfort, gargle with warm salt water. The discomfort should disappear within 24 hours.  If you had a scopolamine patch placed behind your ear for the management of post- operative nausea and/or vomiting:  1. The medication in the patch is effective for 72 hours, after which it should be removed.  Wrap patch in a tissue and discard in the trash. Wash hands thoroughly with soap and water. 2. You may remove the patch earlier than 72 hours if you experience unpleasant side effects which may include dry mouth, dizziness or visual disturbances. 3. Avoid touching the patch. Wash your hands with soap and water after contact with the patch.   Information for Discharge Teaching: EXPAREL (bupivacaine liposome injectable suspension)   Pain relief is important to your recovery. The goal is to control your pain so you can move easier and return to your normal activities as soon as possible after your procedure. Your physician may use several types of medicines to  manage pain, swelling, and more.  Your surgeon or anesthesiologist gave you EXPAREL(bupivacaine) to help control your pain after surgery.  EXPAREL is a local anesthetic designed to release slowly over an extended period of time to provide pain relief by numbing the tissue around the surgical site. EXPAREL is designed to release pain medication over time and can control pain for up to 72 hours. Depending on how you respond to EXPAREL, you may require less pain medication during your recovery. EXPAREL can help reduce or eliminate the need for opioids during the first few days after surgery when pain relief is needed the most. EXPAREL is not an opioid and is not addictive. It does not cause sleepiness or sedation.   Important! A teal colored band has been placed on your arm with the date, time and amount of EXPAREL you have received. Please leave this armband in place for the full 96 hours following administration, and then you may remove the band. If you return to the hospital for any reason within 96 hours following the administration of EXPAREL, the armband provides important information that your health care providers to know, and alerts them that you have received this anesthetic.    Possible side effects of EXPAREL: Temporary loss of sensation or ability to move in the area where medication was injected. Nausea, vomiting, constipation Rarely, numbness and tingling in  your mouth or lips, lightheadedness, or anxiety may occur. Call your doctor right away if you think you may be experiencing any of these sensations, or if you have other questions regarding possible side effects.  Follow all other discharge instructions given to you by your surgeon or nurse. Eat a healthy diet and drink plenty of water or other fluids.

## 2022-12-11 NOTE — Anesthesia Procedure Notes (Addendum)
Anesthesia Regional Block: TAP block   Pre-Anesthetic Checklist: , timeout performed,  Correct Patient, Correct Site, Correct Laterality,  Correct Procedure, Correct Position, site marked,  Risks and benefits discussed,  Surgical consent,  Pre-op evaluation,  At surgeon's request and post-op pain management  Laterality: Right  Prep: chloraprep       Needles:  Injection technique: Single-shot  Needle Type: Echogenic Needle     Needle Length: 10cm  Needle Gauge: 21     Additional Needles:   Narrative:  Start time: 12/11/2022 9:05 AM End time: 12/11/2022 9:09 AM Injection made incrementally with aspirations every 5 mL.  Performed by: Personally  Anesthesiologist: Beryle Lathe, MD  Additional Notes: No pain on injection. No increased resistance to injection. Injection made in 5cc increments. Good needle visualization. Patient tolerated the procedure well.

## 2022-12-11 NOTE — Anesthesia Preprocedure Evaluation (Addendum)
Anesthesia Evaluation    Reviewed: Allergy & Precautions, Patient's Chart, lab work & pertinent test results  History of Anesthesia Complications Negative for: history of anesthetic complications  Airway Mallampati: III  TM Distance: >3 FB Neck ROM: Full    Dental  (+) Dental Advisory Given, Teeth Intact   Pulmonary neg pulmonary ROS   Pulmonary exam normal        Cardiovascular negative cardio ROS Normal cardiovascular exam     Neuro/Psych negative neurological ROS  negative psych ROS   GI/Hepatic Neg liver ROS,GERD  ,,  Endo/Other  negative endocrine ROS    Renal/GU negative Renal ROS     Musculoskeletal negative musculoskeletal ROS (+)    Abdominal   Peds  Hematology negative hematology ROS (+)   Anesthesia Other Findings   Reproductive/Obstetrics                             Anesthesia Physical Anesthesia Plan  ASA: 1  Anesthesia Plan: General   Post-op Pain Management: Tylenol PO (pre-op)*, Regional block* and Celebrex PO (pre-op)*   Induction: Intravenous  PONV Risk Score and Plan: 2 and Treatment may vary due to age or medical condition, Ondansetron, Dexamethasone and Midazolam  Airway Management Planned: LMA  Additional Equipment: None  Intra-op Plan:   Post-operative Plan: Extubation in OR  Informed Consent: I have reviewed the patients History and Physical, chart, labs and discussed the procedure including the risks, benefits and alternatives for the proposed anesthesia with the patient or authorized representative who has indicated his/her understanding and acceptance.     Dental advisory given  Plan Discussed with: CRNA and Anesthesiologist  Anesthesia Plan Comments:        Anesthesia Quick Evaluation

## 2022-12-11 NOTE — Progress Notes (Signed)
Assisted Dr. Mal Amabile with right, transabdominal plane, ultrasound guided block. Side rails up, monitors on throughout procedure. See vital signs in flow sheet. Tolerated Procedure well.

## 2022-12-11 NOTE — Interval H&P Note (Signed)
History and Physical Interval Note: no change in H and P  12/11/2022 8:03 AM  Gabriel Snyder  has presented today for surgery, with the diagnosis of RIGHT INGUINAL HERNIA.  The various methods of treatment have been discussed with the patient and family. After consideration of risks, benefits and other options for treatment, the patient has consented to  Procedure(s) with comments: OPEN HERNIA REPAIR INGUINAL ADULT WITH MESH (Right) - LMA TAP BLOCK as a surgical intervention.  The patient's history has been reviewed, patient examined, no change in status, stable for surgery.  I have reviewed the patient's chart and labs.  Questions were answered to the patient's satisfaction.     Abigail Miyamoto

## 2022-12-11 NOTE — Anesthesia Procedure Notes (Signed)
Procedure Name: LMA Insertion Date/Time: 12/11/2022 9:30 AM  Performed by: Karen Kitchens, CRNAPre-anesthesia Checklist: Patient identified, Emergency Drugs available, Suction available and Patient being monitored Patient Re-evaluated:Patient Re-evaluated prior to induction Oxygen Delivery Method: Circle system utilized Preoxygenation: Pre-oxygenation with 100% oxygen Induction Type: IV induction Ventilation: Mask ventilation without difficulty LMA: LMA inserted LMA Size: 5.0 Number of attempts: 1 Airway Equipment and Method: Bite block Placement Confirmation: positive ETCO2, CO2 detector and breath sounds checked- equal and bilateral Tube secured with: Tape Dental Injury: Teeth and Oropharynx as per pre-operative assessment

## 2022-12-11 NOTE — Anesthesia Postprocedure Evaluation (Signed)
Anesthesia Post Note  Patient: Gabriel Snyder  Procedure(s) Performed: OPEN HERNIA REPAIR INGUINAL ADULT WITH MESH (Right: Groin)     Patient location during evaluation: PACU Anesthesia Type: General Level of consciousness: awake and alert Pain management: pain level controlled Vital Signs Assessment: post-procedure vital signs reviewed and stable Respiratory status: spontaneous breathing, nonlabored ventilation and respiratory function stable Cardiovascular status: stable and blood pressure returned to baseline Anesthetic complications: no   No notable events documented.  Last Vitals:  Vitals:   12/11/22 1030 12/11/22 1052  BP: (!) 135/97 123/81  Pulse: 63 72  Resp: 12 20  Temp:  36.4 C  SpO2: 98% 100%    Last Pain:  Vitals:   12/11/22 1052  TempSrc: Temporal  PainSc: 3                  Beryle Lathe

## 2022-12-11 NOTE — Op Note (Signed)
OPEN HERNIA REPAIR INGUINAL ADULT WITH MESH  Procedure Note  Terrell HALFACRE 12/11/2022   Pre-op Diagnosis: RIGHT INGUINAL HERNIA     Post-op Diagnosis: same  Procedure(s): OPEN HERNIA REPAIR INGUINAL ADULT WITH MESH  Surgeon(s): Abigail Miyamoto, MD  Anesthesia: General  Staff:  Circulator: Maryan Rued, RN Scrub Person: Paulita Fujita A  Estimated Blood Loss: Minimal               Findings: The patient was found to have a direct right inguinal hernia without evidence of indirect hernia.  It was repaired with a large piece of Prolene ProGrip mesh from [employer]  Procedure: The patient was brought to the operating identifies correct patient.  He is placed upon the operating table and general anesthesia was induced.  He had received a preoperative right sided ultrasound-guided tap block by anesthesiology.  His abdomen was prepped and draped in usual sterile fashion.  I anesthetized skin in the right inguinal area with Marcaine.  I then made a longitudinal incision with a scalpel.  I then dissected down through Scarpa's fascia with electrocautery.  The external oblique  fascia was then identified and opened toward the internal and external rings.  I then controlled the testicular cord and structures with a Penrose drain.  He had a moderate-sized direct inguinal hernia without evidence of indirect hernia.  The sac was reduced with no contents present in it.  I then imbricated the floor of the inguinal canal with interrupted silk sutures.  Next a piece of large Prolene ProGrip mesh was brought to the field.  I placed it against the pubic tubercle and brought around the cord structures and back to the tubercle.  I then sutured in place to 2 separate locations of the pubic tubercle with 2-0 Vicryl sutures.  Wide coverage of the inguinal floor and internal ring appeared to be achieved.  I then closed the external oblique fascia over the top of the mesh with a running 2-0 Vicryl suture.   Scarpa's fascia was closed with interrupted 3-0 Vicryl sutures and the skin was closed with running 4-0 Monocryl.  Dermabond was then applied.  The patient tolerated the procedure well.  All the counts were correct at the end of the procedure.  The patient was then extubated in the operating room and taken in a stable condition to the recovery room.          Abigail Miyamoto   Date: 12/11/2022  Time: 10:01 AM

## 2022-12-12 ENCOUNTER — Encounter (HOSPITAL_BASED_OUTPATIENT_CLINIC_OR_DEPARTMENT_OTHER): Payer: Self-pay | Admitting: Surgery

## 2023-01-19 LAB — LIPID PANEL
Chol/HDL Ratio: 2.9 {ratio} (ref 0.0–5.0)
Cholesterol, Total: 142 mg/dL (ref 100–199)
HDL: 49 mg/dL (ref >39)
LDL Chol Calc (NIH): 78 mg/dL (ref 0–99)
Triglycerides: 77 mg/dL (ref 0–149)
VLDL Cholesterol Cal: 15 mg/dL (ref 5–40)

## 2023-01-19 LAB — CMP14+EGFR
ALT: 29 [IU]/L (ref 0–44)
AST: 20 [IU]/L (ref 0–40)
Albumin: 4.2 g/dL (ref 3.8–4.9)
Alkaline Phosphatase: 108 [IU]/L (ref 44–121)
BUN/Creatinine Ratio: 18 (ref 9–20)
BUN: 16 mg/dL (ref 6–24)
Bilirubin Total: 0.3 mg/dL (ref 0.0–1.2)
CO2: 22 mmol/L (ref 20–29)
Calcium: 9.3 mg/dL (ref 8.7–10.2)
Chloride: 103 mmol/L (ref 96–106)
Creatinine, Ser: 0.9 mg/dL (ref 0.76–1.27)
Globulin, Total: 2.2 g/dL (ref 1.5–4.5)
Glucose: 84 mg/dL (ref 70–99)
Potassium: 4.6 mmol/L (ref 3.5–5.2)
Sodium: 139 mmol/L (ref 134–144)
Total Protein: 6.4 g/dL (ref 6.0–8.5)
eGFR: 102 mL/min/{1.73_m2} (ref >59)

## 2023-10-18 ENCOUNTER — Other Ambulatory Visit: Payer: Self-pay | Admitting: Medical-Surgical

## 2023-10-18 DIAGNOSIS — E782 Mixed hyperlipidemia: Secondary | ICD-10-CM

## 2023-11-18 ENCOUNTER — Ambulatory Visit (INDEPENDENT_AMBULATORY_CARE_PROVIDER_SITE_OTHER): Admitting: Medical-Surgical

## 2023-11-18 ENCOUNTER — Encounter: Payer: Self-pay | Admitting: Medical-Surgical

## 2023-11-18 VITALS — BP 135/73 | HR 70 | Resp 20 | Ht 71.0 in | Wt 195.0 lb

## 2023-11-18 DIAGNOSIS — Z125 Encounter for screening for malignant neoplasm of prostate: Secondary | ICD-10-CM | POA: Diagnosis not present

## 2023-11-18 DIAGNOSIS — E782 Mixed hyperlipidemia: Secondary | ICD-10-CM

## 2023-11-18 DIAGNOSIS — Z0000 Encounter for general adult medical examination without abnormal findings: Secondary | ICD-10-CM

## 2023-11-18 MED ORDER — ATORVASTATIN CALCIUM 20 MG PO TABS
20.0000 mg | ORAL_TABLET | Freq: Every day | ORAL | 3 refills | Status: AC
Start: 2023-11-18 — End: ?

## 2023-11-18 NOTE — Patient Instructions (Signed)

## 2023-11-18 NOTE — Progress Notes (Signed)
 Complete physical exam  Patient: Gabriel Snyder   DOB: October 04, 1970   53 y.o. Male  MRN: [id number]  Subjective:    Chief Complaint  Patient presents with   Annual Exam    Gabriel Snyder is a 53 y.o. male who presents today for a complete physical exam. He reports consuming a general diet. Some intermittent exercise, mostly walking. He generally feels well. He reports sleeping well. He does not have additional problems to discuss today.    Most recent fall risk assessment:    06/22/2021    1:34 PM  Fall Risk   Falls in the past year? 0  Number falls in past yr: 0  Injury with Fall? 0  Risk for fall due to : No Fall Risks  Follow up Falls evaluation completed      Data saved with a previous flowsheet row definition     Most recent depression screenings:    11/18/2023    8:58 AM 10/09/2022   10:12 AM  PHQ 2/9 Scores  PHQ - 2 Score 0 0    Vision:Within last year, Dental: No current dental problems and Receives regular dental care, and PSA: Prostate cancer screening and PSA options (with potential risks and benefits of testing vs. not testing) were discussed along with recent recs/guidelines.     Patient Care Team: Willo Mini, NP as PCP - General (Nurse Practitioner)   Outpatient Medications Prior to Visit  Medication Sig   cholecalciferol (VITAMIN D3) 25 MCG (1000 UNIT) tablet Take 1,000 Units by mouth daily.   zinc gluconate 50 MG tablet Take 50 mg by mouth daily.   [DISCONTINUED] atorvastatin  (LIPITOR) 20 MG tablet Take 1 tablet (20 mg total) by mouth daily. NEEDS APPOINTMENT FOR FURTHER REFILLS.   [DISCONTINUED] traMADol  (ULTRAM ) 50 MG tablet Take 1 tablet (50 mg total) by mouth every 6 (six) hours as needed for moderate pain (pain score 4-6) or severe pain (pain score 7-10).   No facility-administered medications prior to visit.    Review of Systems  Constitutional:  Negative for chills, fever, malaise/fatigue and weight loss.  HENT:  Negative for congestion,  ear pain, hearing loss, sinus pain and sore throat.   Eyes:  Negative for blurred vision, photophobia and pain.  Respiratory:  Negative for cough, shortness of breath and wheezing.   Cardiovascular:  Negative for chest pain, palpitations and leg swelling.  Gastrointestinal:  Negative for abdominal pain, constipation, diarrhea, heartburn, nausea and vomiting.  Genitourinary:  Negative for dysuria, frequency and urgency.  Musculoskeletal:  Negative for falls and neck pain.  Skin:  Negative for itching and rash.  Neurological:  Negative for dizziness, weakness and headaches.  Endo/Heme/Allergies:  Negative for polydipsia. Does not bruise/bleed easily.  Psychiatric/Behavioral:  Negative for depression, substance abuse and suicidal ideas. The patient is not nervous/anxious.           Objective:     BP 135/73 (BP Location: Left Arm, Cuff Size: Normal)   Pulse 70   Resp 20   Ht 5' 11 (1.803 m)   Wt 195 lb (88.5 kg)   SpO2 100%   BMI 27.20 kg/m    Physical Exam Vitals reviewed.  Constitutional:      General: He is not in acute distress.    Appearance: Normal appearance. He is not ill-appearing.  HENT:     Head: Normocephalic and atraumatic.     Right Ear: Tympanic membrane, ear canal and external ear normal. There is no impacted cerumen.  Left Ear: Tympanic membrane, ear canal and external ear normal. There is no impacted cerumen.     Nose: Nose normal. No congestion or rhinorrhea.     Mouth/Throat:     Mouth: Mucous membranes are moist.     Pharynx: No oropharyngeal exudate or posterior oropharyngeal erythema.  Eyes:     General: No scleral icterus.       Right eye: No discharge.        Left eye: No discharge.     Extraocular Movements: Extraocular movements intact.     Conjunctiva/sclera: Conjunctivae normal.     Pupils: Pupils are equal, round, and reactive to light.  Neck:     Thyroid: No thyromegaly.     Vascular: No carotid bruit or JVD.     Trachea: Trachea  normal.  Cardiovascular:     Rate and Rhythm: Normal rate and regular rhythm.     Pulses: Normal pulses.     Heart sounds: Normal heart sounds. No murmur heard.    No friction rub. No gallop.  Pulmonary:     Effort: Pulmonary effort is normal. No respiratory distress.     Breath sounds: Normal breath sounds. No wheezing.  Abdominal:     General: Bowel sounds are normal. There is no distension.     Palpations: Abdomen is soft.     Tenderness: There is no abdominal tenderness. There is no guarding.  Musculoskeletal:        General: Normal range of motion.     Cervical back: Normal range of motion and neck supple.  Lymphadenopathy:     Cervical: No cervical adenopathy.  Skin:    General: Skin is warm and dry.     Findings: Lesion: .diagm.  Neurological:     Mental Status: He is alert and oriented to person, place, and time.     Cranial Nerves: No cranial nerve deficit.  Psychiatric:        Mood and Affect: Mood normal.        Behavior: Behavior normal.        Thought Content: Thought content normal.        Judgment: Judgment normal.   No results found for any visits on 11/18/23.     Assessment & Plan:    Routine Health Maintenance and Physical Exam  Immunization History  Administered Date(s) Administered   Influenza, Seasonal, Injecte, Preservative Fre 10/09/2022   Influenza-Unspecified 11/11/2019, 11/14/2023   Moderna Sars-Covid-2 Vaccination 01/28/2019, 02/26/2019   PFIZER Comirnaty(Gray Top)Covid-19 Tri-Sucrose Vaccine 08/10/2020   PFIZER(Purple Top)SARS-COV-2 Vaccination 12/08/2019   Pfizer(Comirnaty)Fall Seasonal Vaccine 12 years and older 10/09/2022   Tdap 11/11/2012, 10/27/2020   Zoster Recombinant(Shingrix ) 06/22/2021, 08/28/2021    Health Maintenance  Topic Date Due   Hepatitis B Vaccines 19-59 Average Risk (1 of 3 - 19+ 3-dose series) Never done   Pneumococcal Vaccine: 50+ Years (1 of 1 - PCV) Never done   Colonoscopy  06/10/2023   COVID-19 Vaccine (6 -  2025-26 season) 12/04/2023 (Originally 09/30/2023)   DTaP/Tdap/Td (3 - Td or Tdap) 10/28/2030   Influenza Vaccine  Completed   Hepatitis C Screening  Completed   HIV Screening  Completed   Zoster Vaccines- Shingrix   Completed   HPV VACCINES  Aged Out   Meningococcal B Vaccine  Aged Out    Discussed health benefits of physical activity, and encouraged him to engage in regular exercise appropriate for his age and condition.  1. Annual physical exam (Primary) Checking labs as below. UTD on  preventative care. Wellness information provided with AVS. - CBC - CMP14+EGFR - Lipid panel  2. Mixed hyperlipidemia Checking lipids today.  Continue atorvastatin  20 mg daily. - CMP14+EGFR - Lipid panel - atorvastatin  (LIPITOR) 20 MG tablet; Take 1 tablet (20 mg total) by mouth daily.  Dispense: 390 tablet; Refill: 3  3. Prostate cancer screening Checking PSA. - PSA Total (Reflex To Free)   Return in about 1 year (around 11/17/2024) for annual physical exam or sooner if needed.     Jorryn Hershberger, NP

## 2023-11-19 ENCOUNTER — Ambulatory Visit: Payer: Self-pay | Admitting: Medical-Surgical

## 2023-11-19 LAB — CMP14+EGFR
ALT: 28 IU/L (ref 0–44)
AST: 26 IU/L (ref 0–40)
Albumin: 4.4 g/dL (ref 3.8–4.9)
Alkaline Phosphatase: 103 IU/L (ref 47–123)
BUN/Creatinine Ratio: 12 (ref 9–20)
BUN: 12 mg/dL (ref 6–24)
Bilirubin Total: 0.5 mg/dL (ref 0.0–1.2)
CO2: 21 mmol/L (ref 20–29)
Calcium: 9.8 mg/dL (ref 8.7–10.2)
Chloride: 103 mmol/L (ref 96–106)
Creatinine, Ser: 0.97 mg/dL (ref 0.76–1.27)
Globulin, Total: 2.5 g/dL (ref 1.5–4.5)
Glucose: 83 mg/dL (ref 70–99)
Potassium: 4.9 mmol/L (ref 3.5–5.2)
Sodium: 138 mmol/L (ref 134–144)
Total Protein: 6.9 g/dL (ref 6.0–8.5)
eGFR: 93 mL/min/1.73 (ref >59)

## 2023-11-19 LAB — CBC
Hematocrit: 49.1 % (ref 37.5–51.0)
Hemoglobin: 16.4 g/dL (ref 13.0–17.7)
MCH: 31.8 pg (ref 26.6–33.0)
MCHC: 33.4 g/dL (ref 31.5–35.7)
MCV: 95 fL (ref 79–97)
Platelets: 248 x10E3/uL (ref 150–450)
RBC: 5.15 x10E6/uL (ref 4.14–5.80)
RDW: 12 % (ref 11.6–15.4)
WBC: 7.5 x10E3/uL (ref 3.4–10.8)

## 2023-11-19 LAB — LIPID PANEL
Chol/HDL Ratio: 2.8 ratio (ref 0.0–5.0)
Cholesterol, Total: 154 mg/dL (ref 100–199)
HDL: 56 mg/dL (ref >39)
LDL Chol Calc (NIH): 82 mg/dL (ref 0–99)
Triglycerides: 84 mg/dL (ref 0–149)
VLDL Cholesterol Cal: 16 mg/dL (ref 5–40)

## 2023-11-19 LAB — PSA TOTAL (REFLEX TO FREE): Prostate Specific Ag, Serum: 0.5 ng/mL (ref 0.0–4.0)
